# Patient Record
Sex: Female | Born: 1990
Health system: Southern US, Community
[De-identification: ages and names within clinical notes are randomized; demographics above are authoritative.]

## PROBLEM LIST (undated history)

## (undated) DIAGNOSIS — L0231 Cutaneous abscess of buttock: Secondary | ICD-10-CM

## (undated) HISTORY — DX: Cutaneous abscess of buttock: L02.31

---
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
## 2010-06-21 ENCOUNTER — Ambulatory Visit: Payer: Self-pay | Admitting: Nurse Practitioner

## 2010-06-21 ENCOUNTER — Inpatient Hospital Stay (HOSPITAL_COMMUNITY): Admission: AD | Admit: 2010-06-21 | Discharge: 2010-06-21 | Payer: Self-pay | Admitting: Obstetrics and Gynecology

## 2011-01-19 ENCOUNTER — Emergency Department (HOSPITAL_COMMUNITY)
Admission: EM | Admit: 2011-01-19 | Discharge: 2011-01-19 | Disposition: A | Discharge status: Home / Self Care | Payer: Medicaid Other | Attending: Emergency Medicine | Admitting: Emergency Medicine

## 2011-01-19 DIAGNOSIS — L0231 Cutaneous abscess of buttock: Secondary | ICD-10-CM | POA: Insufficient documentation

## 2011-01-19 DIAGNOSIS — L03317 Cellulitis of buttock: Secondary | ICD-10-CM | POA: Insufficient documentation

## 2011-02-22 LAB — WET PREP, GENITAL

## 2011-03-11 ENCOUNTER — Emergency Department (HOSPITAL_COMMUNITY)
Admission: EM | Admit: 2011-03-11 | Discharge: 2011-03-12 | Disposition: A | Discharge status: Home / Self Care | Payer: Medicaid Other | Attending: Emergency Medicine | Admitting: Emergency Medicine

## 2011-03-11 DIAGNOSIS — L03317 Cellulitis of buttock: Secondary | ICD-10-CM | POA: Insufficient documentation

## 2011-03-11 DIAGNOSIS — L0231 Cutaneous abscess of buttock: Secondary | ICD-10-CM | POA: Insufficient documentation

## 2011-03-13 ENCOUNTER — Emergency Department (HOSPITAL_COMMUNITY)
Admission: EM | Admit: 2011-03-13 | Discharge: 2011-03-13 | Disposition: A | Discharge status: Home / Self Care | Payer: Medicaid Other | Attending: Emergency Medicine | Admitting: Emergency Medicine

## 2011-03-13 DIAGNOSIS — L03317 Cellulitis of buttock: Secondary | ICD-10-CM | POA: Insufficient documentation

## 2011-03-13 DIAGNOSIS — L0231 Cutaneous abscess of buttock: Secondary | ICD-10-CM | POA: Insufficient documentation

## 2011-03-13 DIAGNOSIS — Z09 Encounter for follow-up examination after completed treatment for conditions other than malignant neoplasm: Secondary | ICD-10-CM | POA: Insufficient documentation

## 2011-03-30 ENCOUNTER — Inpatient Hospital Stay (INDEPENDENT_AMBULATORY_CARE_PROVIDER_SITE_OTHER)
Admission: RE | Admit: 2011-03-30 | Discharge: 2011-03-30 | Disposition: A | Discharge status: Home / Self Care | Payer: Medicaid Other | Source: Ambulatory Visit | Attending: Emergency Medicine | Admitting: Emergency Medicine

## 2011-03-30 DIAGNOSIS — A499 Bacterial infection, unspecified: Secondary | ICD-10-CM

## 2011-03-30 DIAGNOSIS — N760 Acute vaginitis: Secondary | ICD-10-CM

## 2011-03-30 LAB — POCT PREGNANCY, URINE: Preg Test, Ur: NEGATIVE

## 2011-03-30 LAB — WET PREP, GENITAL
Trich, Wet Prep: NONE SEEN
Yeast Wet Prep HPF POC: NONE SEEN

## 2011-04-25 NOTE — Op Note (Signed)
   Pamela Pratt, Pamela Pratt                         ACCOUNT NO.:  [id number]   MEDICAL RECORD NO.:  [id number]                   PATIENT TYPE:  AMB   LOCATION:  DSC                                  FACILITY:  [facility]   PHYSICIAN:  Vikki Ports, M.D.         DATE OF BIRTH:  [birth year]   DATE OF PROCEDURE:  09/23/2002  DATE OF DISCHARGE:                                 OPERATIVE REPORT   PREOPERATIVE DIAGNOSIS:  Left breast mass.   POSTOPERATIVE DIAGNOSIS:  Left breast mass.   PROCEDURE:  Excisional left breast biopsy.   SURGEON:  Vikki Ports, M.D.   ANESTHESIA:  General.   DESCRIPTION OF PROCEDURE:  The patient was taken to the operating room and  placed in the supine position. After adequate general anesthesia was induced  using laryngeal mask, the left breast was prepped and draped in the usual  sterile fashion.  Using a curvilinear incision extending from 4 o'clock to  the 6 o'clock periareolar, skin was excised and dissected down through  subcutaneous tissue.  Under what appeared to be an intraductal papilloma  surrounded by a well-encapsulated fatty lobule. This was excised in its  entirety. Adequate hemostasis was assured and the specimen was sent to  pathology.  The skin was closed with subcuticular 4-0 Monocryl.  Steri-  Strips and sterile dressings were applied. The patient tolerated the  procedure well and went to PACU in good condition.                                               Vikki Ports, M.D.    KRH/MEDQ  D:  09/23/2002  T:  09/25/2002  Job:  [id number]

## 2011-06-02 ENCOUNTER — Other Ambulatory Visit: Payer: Self-pay | Admitting: Obstetrics & Gynecology

## 2011-06-02 HISTORY — PX: COLPOSCOPY: SHX161

## 2011-07-09 DIAGNOSIS — L0231 Cutaneous abscess of buttock: Secondary | ICD-10-CM

## 2011-07-09 HISTORY — DX: Cutaneous abscess of buttock: L02.31

## 2011-07-26 ENCOUNTER — Emergency Department (HOSPITAL_COMMUNITY)
Admission: EM | Admit: 2011-07-26 | Discharge: 2011-07-26 | Disposition: A | Discharge status: Home / Self Care | Payer: Medicaid Other | Attending: Emergency Medicine | Admitting: Emergency Medicine

## 2011-07-26 DIAGNOSIS — L0231 Cutaneous abscess of buttock: Secondary | ICD-10-CM | POA: Insufficient documentation

## 2011-07-28 ENCOUNTER — Emergency Department (HOSPITAL_COMMUNITY)
Admission: EM | Admit: 2011-07-28 | Discharge: 2011-07-28 | Disposition: A | Discharge status: Home / Self Care | Payer: Medicaid Other | Attending: Emergency Medicine | Admitting: Emergency Medicine

## 2011-07-28 DIAGNOSIS — L03317 Cellulitis of buttock: Secondary | ICD-10-CM | POA: Insufficient documentation

## 2011-07-28 DIAGNOSIS — N898 Other specified noninflammatory disorders of vagina: Secondary | ICD-10-CM | POA: Insufficient documentation

## 2011-07-28 DIAGNOSIS — Z09 Encounter for follow-up examination after completed treatment for conditions other than malignant neoplasm: Secondary | ICD-10-CM | POA: Insufficient documentation

## 2011-07-28 DIAGNOSIS — L0231 Cutaneous abscess of buttock: Secondary | ICD-10-CM | POA: Insufficient documentation

## 2011-07-28 LAB — WET PREP, GENITAL: Yeast Wet Prep HPF POC: NONE SEEN

## 2011-07-28 LAB — URINALYSIS, ROUTINE W REFLEX MICROSCOPIC
Glucose, UA: NEGATIVE mg/dL
Hgb urine dipstick: NEGATIVE
Leukocytes, UA: NEGATIVE
pH: 6.5 (ref 5.0–8.0)

## 2011-08-18 ENCOUNTER — Encounter (INDEPENDENT_AMBULATORY_CARE_PROVIDER_SITE_OTHER): Payer: Self-pay | Admitting: Surgery

## 2011-08-22 ENCOUNTER — Ambulatory Visit (INDEPENDENT_AMBULATORY_CARE_PROVIDER_SITE_OTHER): Payer: Medicaid Other | Admitting: Surgery

## 2011-08-22 ENCOUNTER — Encounter (INDEPENDENT_AMBULATORY_CARE_PROVIDER_SITE_OTHER): Payer: Self-pay | Admitting: Surgery

## 2011-08-22 VITALS — BP 100/80 | HR 66 | Temp 98.6°F | Ht 63.0 in | Wt 135.0 lb

## 2011-08-22 DIAGNOSIS — L03317 Cellulitis of buttock: Secondary | ICD-10-CM

## 2011-08-22 DIAGNOSIS — L0231 Cutaneous abscess of buttock: Secondary | ICD-10-CM

## 2011-08-22 NOTE — Progress Notes (Signed)
ASSESSMENT AND PLAN: 1.  Bilateral  buttocks abscess.  Right abscess occurred about 5 months ago.  Left  abscess occurred about 1 month ago.  Both areas are healed.  There is no residual inflammation or mass.  I gave the patient a book on rectal disease.  2.  Smokes.  Knows it is bad for her health. 3.  On BCP.  Chief Complaint  Patient presents with  . Rectal Problems    hx of rectal abscess    HISTORY OF PRESENT ILLNESS: Pamela Pratt is a 20 y.o. (DOB: 10-26-1991)  AA female who is a patient of Willette Cluster, NP, [facility], and comes to me today for buttocks abscess.  The patient has had 2 episodes of buttocks abscesses. The first was on the left side and she went to the [facility] emergency room about 4 months ago. This was drained in the emergency room and the wound healed.  She then developed an abscess in the right buttocks, returned to the [facility] emergency room, and the right abscess was drained. This has also healed. She is seeing Willette Cluster at [facility].  She has had no prior history of skin disease or skin infections. 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  Past Medical History  Diagnosis Date  . Abscess of buttock, right 07/2011  . Asthma     Past Surgical History  Procedure Date  . Colposcopy 06/02/2011  . Breast surgery 2003    cyst    Current Outpatient Prescriptions  Medication Sig Dispense Refill  . levonorgestrel-ethinyl estradiol (SEASONALE,INTROVALE,JOLESSA) 0.15-0.03 MG tablet Take 1 tablet by mouth daily.          Allergies no known allergies  REVIEW OF SYSTEMS: Skin:  No history of rash.  No history of abnormal moles. Infection:  No history of hepatitis or HIV.  No history of MRSA. Neurologic:  No history of stroke.  No history of seizure.  No history of headaches. Cardiac:  No history of hypertension. No history of heart disease.  No history of prior cardiac  catheterization.  No history of seeing a cardiologist. Pulmonary:  Does not smoke cigarettes.  No asthma or bronchitis.  No OSA/CPAP.  Endocrine:  No diabetes. No thyroid disease. Gastrointestinal:  No history of stomach disease.  No history of liver disease.  No history of gall bladder disease.  No history of pancreas disease.  No history of colon disease. Urologic:  No history of kidney stones.  No history of bladder infections. Musculoskeletal:  No history of joint or back disease. Hematologic:  No bleeding disorder.  No history of anemia.  Not anticoagulated.  SOCIAL and FAMILY HISTORY: Comes by self.  At school at [employer] and works at [employer].  PHYSICAL EXAM: BP 100/80  Pulse 66  Temp(Src) 98.6 F (37 C) (Temporal)  Ht 5\' 3"  (1.6 m)  Wt 135 lb (61.236 kg)  BMI 23.91 kg/m2  General: Thin BF. HEENT: Normal. Pupils equal. Normal dentition. Neck: Supple. No thyroid mass.  Abdomen: No mass. No tenderness. No hernia. Normal bowel sounds.  No abdominal scars. Rectal: Scars in both buttocks.  No residual lesion or mass or inflammation.  Rectum - Okay. Extremities:  Good strength in upper and lower extremities.   DATA REVIEWED: Office notes from Willette Cluster, NP, [facility].    _____________________________  Ovidio Kin, M.D., FACS

## 2011-09-13 ENCOUNTER — Emergency Department (HOSPITAL_COMMUNITY): Payer: Medicaid Other

## 2011-09-13 ENCOUNTER — Emergency Department (HOSPITAL_COMMUNITY)
Admission: EM | Admit: 2011-09-13 | Discharge: 2011-09-13 | Disposition: A | Discharge status: Home / Self Care | Payer: Medicaid Other | Attending: Emergency Medicine | Admitting: Emergency Medicine

## 2011-09-13 DIAGNOSIS — X500XXA Overexertion from strenuous movement or load, initial encounter: Secondary | ICD-10-CM | POA: Insufficient documentation

## 2011-09-13 DIAGNOSIS — S93409A Sprain of unspecified ligament of unspecified ankle, initial encounter: Secondary | ICD-10-CM | POA: Insufficient documentation

## 2011-09-13 DIAGNOSIS — R609 Edema, unspecified: Secondary | ICD-10-CM | POA: Insufficient documentation

## 2011-09-13 DIAGNOSIS — M25579 Pain in unspecified ankle and joints of unspecified foot: Secondary | ICD-10-CM | POA: Insufficient documentation

## 2013-01-18 ENCOUNTER — Encounter (HOSPITAL_COMMUNITY): Payer: Self-pay | Admitting: *Deleted

## 2013-01-18 ENCOUNTER — Emergency Department (HOSPITAL_COMMUNITY)
Admission: EM | Admit: 2013-01-18 | Discharge: 2013-01-18 | Disposition: A | Discharge status: Home / Self Care | Payer: Self-pay | Attending: Emergency Medicine | Admitting: Emergency Medicine

## 2013-01-18 DIAGNOSIS — Z9889 Other specified postprocedural states: Secondary | ICD-10-CM | POA: Insufficient documentation

## 2013-01-18 DIAGNOSIS — Z3202 Encounter for pregnancy test, result negative: Secondary | ICD-10-CM | POA: Insufficient documentation

## 2013-01-18 DIAGNOSIS — F17200 Nicotine dependence, unspecified, uncomplicated: Secondary | ICD-10-CM | POA: Insufficient documentation

## 2013-01-18 DIAGNOSIS — Z872 Personal history of diseases of the skin and subcutaneous tissue: Secondary | ICD-10-CM | POA: Insufficient documentation

## 2013-01-18 DIAGNOSIS — A64 Unspecified sexually transmitted disease: Secondary | ICD-10-CM | POA: Insufficient documentation

## 2013-01-18 DIAGNOSIS — J45909 Unspecified asthma, uncomplicated: Secondary | ICD-10-CM | POA: Insufficient documentation

## 2013-01-18 LAB — WET PREP, GENITAL

## 2013-01-18 LAB — URINALYSIS, ROUTINE W REFLEX MICROSCOPIC
Glucose, UA: NEGATIVE mg/dL
Hgb urine dipstick: NEGATIVE
Ketones, ur: NEGATIVE mg/dL
Protein, ur: NEGATIVE mg/dL

## 2013-01-18 LAB — POCT PREGNANCY, URINE: Preg Test, Ur: NEGATIVE

## 2013-01-18 MED ORDER — CEFTRIAXONE SODIUM 250 MG IJ SOLR
250.0000 mg | Freq: Once | INTRAMUSCULAR | Status: AC
  Administered 2013-01-18: 250 mg via INTRAMUSCULAR
  Filled 2013-01-18: qty 250

## 2013-01-18 MED ORDER — DOXYCYCLINE HYCLATE 100 MG PO CAPS: 100.0000 mg | ORAL_CAPSULE | Freq: Two times a day (BID) | ORAL | Status: DC

## 2013-01-18 NOTE — ED Notes (Signed)
Pt alert and oriented x4. Respirations even and unlabored, bilateral symmetrical rise and fall of chest. Skin warm and dry. In no acute distress. Denies needs.   

## 2013-01-18 NOTE — ED Notes (Signed)
Pt escorted to discharge window. Pt verbalized understanding discharge instructions. In no acute distress.  Pt stated she does not want to take PO abx until she is positive she has chlamydia. Pt made aware her need to start taking abx. Pt reports she will wait until Monday [facility].
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
## 2013-01-19 LAB — GC/CHLAMYDIA PROBE AMP
CT Probe RNA: NEGATIVE
GC Probe RNA: NEGATIVE

## 2013-09-10 ENCOUNTER — Encounter (HOSPITAL_COMMUNITY): Payer: Self-pay | Admitting: Emergency Medicine

## 2013-09-10 ENCOUNTER — Emergency Department (HOSPITAL_COMMUNITY)
Admission: EM | Admit: 2013-09-10 | Discharge: 2013-09-10 | Disposition: A | Discharge status: Home / Self Care | Payer: Medicaid Other | Attending: Emergency Medicine | Admitting: Emergency Medicine

## 2013-09-10 DIAGNOSIS — L0501 Pilonidal cyst with abscess: Secondary | ICD-10-CM | POA: Insufficient documentation

## 2013-09-10 DIAGNOSIS — J45909 Unspecified asthma, uncomplicated: Secondary | ICD-10-CM | POA: Insufficient documentation

## 2013-09-10 DIAGNOSIS — F17200 Nicotine dependence, unspecified, uncomplicated: Secondary | ICD-10-CM | POA: Insufficient documentation

## 2013-09-10 DIAGNOSIS — Z79899 Other long term (current) drug therapy: Secondary | ICD-10-CM | POA: Insufficient documentation

## 2013-09-10 DIAGNOSIS — Z23 Encounter for immunization: Secondary | ICD-10-CM | POA: Insufficient documentation

## 2013-09-10 DIAGNOSIS — IMO0001 Reserved for inherently not codable concepts without codable children: Secondary | ICD-10-CM | POA: Insufficient documentation

## 2013-09-10 MED ORDER — HYDROCODONE-ACETAMINOPHEN 5-325 MG PO TABS: 1.0000 | ORAL_TABLET | ORAL | Status: DC | PRN

## 2013-09-10 MED ORDER — TETANUS-DIPHTH-ACELL PERTUSSIS 5-2.5-18.5 LF-MCG/0.5 IM SUSP
0.5000 mL | Freq: Once | INTRAMUSCULAR | Status: AC
  Administered 2013-09-10: 0.5 mL via INTRAMUSCULAR
  Filled 2013-09-10 (×2): qty 0.5

## 2013-09-10 MED ORDER — LIDOCAINE-EPINEPHRINE 2 %-1:100000 IJ SOLN
20.0000 mL | Freq: Once | INTRAMUSCULAR | Status: AC
  Administered 2013-09-10: 20 mL via INTRADERMAL
  Filled 2013-09-10: qty 1

## 2013-09-10 NOTE — ED Notes (Signed)
Pt c/o pylonidal cyst x 1 wk.

## 2013-09-10 NOTE — ED Provider Notes (Signed)
CSN: [id number]     Arrival date & time 09/10/13  7829 History   First MD Initiated Contact with Patient 09/10/13 [phone]     Chief Complaint  Patient presents with  . Abscess   (Consider location/radiation/quality/duration/timing/severity/associated sxs/prior Treatment) HPI  22 year old female with history of abscess to her buttock presents for evaluations of abscess. Patient reports gradual onset of pain and swelling to her mid buttock ongoing for a week. Symptoms getting progressively worse. Painful to palpation. Describes a sharp and throbbing pain. Feels similar to prior abscess. She has been using warm compress once daily with minimal relief. No other specific treatment tried no complaints of fever, rectal pain, blood per rectum, any recent injury, or rash. She cannot recall her last tetanus shot.  Past Medical History  Diagnosis Date  . Abscess of buttock, right 07/2011  . Asthma    Past Surgical History  Procedure Laterality Date  . Colposcopy  06/02/2011  . Breast surgery  2003    cyst   No family history on file. History  Substance Use Topics  . Smoking status: Current Every Day Smoker -- 0.30 packs/day    Types: Cigarettes  . Smokeless tobacco: Not on file  . Alcohol Use: Yes   OB History   Grav Para Term Preterm Abortions TAB SAB Ect Mult Living                 Review of Systems  Constitutional: Negative for fever.  Gastrointestinal: Negative for blood in stool and rectal pain.  Musculoskeletal: Positive for myalgias.  Skin: Negative for rash.    Allergies  Review of patient's allergies indicates no known allergies.  Home Medications   Current Outpatient Rx  Name  Route  Sig  Dispense  Refill  . Cholecalciferol (VITAMIN D3) 1000 UNITS CAPS   Oral   Take 1,000 Units by mouth daily at 12 noon.         Marland Kitchen doxycycline (VIBRAMYCIN) 100 MG capsule   Oral   Take 1 capsule (100 mg total) by mouth 2 (two) times daily.   20 capsule   0   . hydrocortisone  cream 1 %   Topical   Apply 1 application topically daily as needed (Uses after shaving.).         Marland Kitchen levonorgestrel-ethinyl estradiol (ALTAVERA) 0.15-30 MG-MCG tablet   Oral   Take 1 tablet by mouth daily at 12 noon.         . Multiple Vitamin (MULTIVITAMIN WITH MINERALS) TABS   Oral   Take 1 tablet by mouth daily at 12 noon.         . vitamin C (ASCORBIC ACID) 500 MG tablet   Oral   Take 500 mg by mouth daily at 12 noon.          There were no vitals taken for this visit. Physical Exam  Nursing note and vitals reviewed. Constitutional: She appears well-nourished. No distress.  Neck: Neck supple.  Abdominal: There is no tenderness.  Genitourinary:  Chaperone present  piloninal cyst with abscess noted.  Ttp.  No obvious rectal involvement.  No cellulitis  Neurological: She is alert.  Skin: Skin is warm.  Psychiatric: She has a normal mood and affect.    ED Course  Procedures (including critical care time)  INCISION AND DRAINAGE Performed by: Fayrene Helper Consent: Verbal consent obtained. Risks and benefits: risks, benefits and alternatives were discussed Type: abscess  Body area: pilonidal cyst  Anesthesia: local infiltration  Incision was  made with a scalpel.  Local anesthetic: lidocaine 2% w/o epinephrine  Anesthetic total: 5 ml  Complexity: complex Blunt dissection to break up loculations  Drainage: purulent  Drainage amount: moderate  Packing material: 1/4 in iodoform gauze  Patient tolerance: Patient tolerated the procedure well with no immediate complications.    Labs Review Labs Reviewed - No data to display Imaging Review No results found.  MDM   1. Pilonidal cyst with abscess    BP 128/80  Pulse 67  Temp(Src) 99.2 F (37.3 C) (Oral)  Resp 16  SpO2 100%     Fayrene Helper, PA-C 09/10/13 1106

## 2013-09-12 ENCOUNTER — Encounter: Payer: Self-pay | Admitting: Obstetrics

## 2013-09-12 NOTE — ED Provider Notes (Signed)
Medical screening examination/treatment/procedure(s) were performed by non-physician practitioner and as supervising physician I was immediately available for consultation/collaboration.   Laray Anger, DO 09/12/13 1301

## 2013-09-19 ENCOUNTER — Emergency Department (HOSPITAL_COMMUNITY)
Admission: EM | Admit: 2013-09-19 | Discharge: 2013-09-20 | Disposition: A | Discharge status: Home / Self Care | Payer: Medicaid Other | Attending: Emergency Medicine | Admitting: Emergency Medicine

## 2013-09-19 ENCOUNTER — Encounter (HOSPITAL_COMMUNITY): Payer: Self-pay | Admitting: Emergency Medicine

## 2013-09-19 DIAGNOSIS — Z3202 Encounter for pregnancy test, result negative: Secondary | ICD-10-CM | POA: Insufficient documentation

## 2013-09-19 DIAGNOSIS — N939 Abnormal uterine and vaginal bleeding, unspecified: Secondary | ICD-10-CM

## 2013-09-19 DIAGNOSIS — Z79899 Other long term (current) drug therapy: Secondary | ICD-10-CM | POA: Insufficient documentation

## 2013-09-19 DIAGNOSIS — N898 Other specified noninflammatory disorders of vagina: Secondary | ICD-10-CM | POA: Insufficient documentation

## 2013-09-19 DIAGNOSIS — F17200 Nicotine dependence, unspecified, uncomplicated: Secondary | ICD-10-CM | POA: Insufficient documentation

## 2013-09-19 DIAGNOSIS — Z872 Personal history of diseases of the skin and subcutaneous tissue: Secondary | ICD-10-CM | POA: Insufficient documentation

## 2013-09-19 DIAGNOSIS — J45909 Unspecified asthma, uncomplicated: Secondary | ICD-10-CM | POA: Insufficient documentation

## 2013-09-19 LAB — POCT I-STAT, CHEM 8
BUN: 18 mg/dL (ref 6–23)
Calcium, Ion: 1.12 mmol/L (ref 1.12–1.23)
Glucose, Bld: 88 mg/dL (ref 70–99)
HCT: 42 % (ref 36.0–46.0)
Hemoglobin: 14.3 g/dL (ref 12.0–15.0)
Potassium: 4.7 mEq/L (ref 3.5–5.1)
Sodium: 138 mEq/L (ref 135–145)
TCO2: 23 mmol/L (ref 0–100)

## 2013-09-19 NOTE — ED Notes (Signed)
Pt report vaginal bleeding since the 7th of last month. Bleeding accompanied with ab pain. Pt denies n/v.FLow rate varied from spotting and gradually getting heavier. Pt reports using pad and tampons and changing every 2 hours.

## 2013-09-20 LAB — URINALYSIS, ROUTINE W REFLEX MICROSCOPIC
Glucose, UA: NEGATIVE mg/dL
Hgb urine dipstick: NEGATIVE
Ketones, ur: 15 mg/dL — AB
Leukocytes, UA: NEGATIVE
Nitrite: NEGATIVE
Specific Gravity, Urine: 1.031 — ABNORMAL HIGH (ref 1.005–1.030)
Urobilinogen, UA: 0.2 mg/dL (ref 0.0–1.0)

## 2013-09-20 LAB — WET PREP, GENITAL
Clue Cells Wet Prep HPF POC: NONE SEEN
Yeast Wet Prep HPF POC: NONE SEEN

## 2013-09-20 LAB — GC/CHLAMYDIA PROBE AMP: CT Probe RNA: NEGATIVE

## 2013-09-20 LAB — URINE MICROSCOPIC-ADD ON

## 2013-09-20 NOTE — ED Provider Notes (Signed)
CSN: [id number]     Arrival date & time 09/19/13  2115 History   First MD Initiated Contact with Patient 09/19/13 2256     Chief Complaint  Patient presents with  . Vaginal Bleeding   (Consider location/radiation/quality/duration/timing/severity/associated sxs/prior Treatment) HPI Comments: Patient presents today with a chief complaint of vaginal bleeding.  She reports that the bleeding has been present since 08-14-13.   She reports that the bleeding has been heavy at times and spotting at times.  She has also had intermittent lower abdominal cramping over the past month.  She reports that the pain is "very mild" at this time.  She reports that her last regular menstrual period was in February of 2013.  She is currently on Depo.  Last Depo shot was three months ago.  She denies any vaginal discharge.  She reports that she is currently sexually active.  She denies nausea, vomiting, diarrhea, fever, chills, SOB, dizziness, or lightheadedness.  She reports that her Gynecologist is at [facility].  The history is provided by the patient.    Past Medical History  Diagnosis Date  . Abscess of buttock, right 07/2011  . Asthma    Past Surgical History  Procedure Laterality Date  . Colposcopy  06/02/2011  . Breast surgery  2003    cyst   History reviewed. No pertinent family history. History  Substance Use Topics  . Smoking status: Current Every Day Smoker -- 0.30 packs/day    Types: Cigarettes  . Smokeless tobacco: Not on file  . Alcohol Use: Yes   OB History   Grav Para Term Preterm Abortions TAB SAB Ect Mult Living                 Review of Systems  Genitourinary: Positive for vaginal discharge.  All other systems reviewed and are negative.    Allergies  Review of patient's allergies indicates no known allergies.  Home Medications   Current Outpatient Rx  Name  Route  Sig  Dispense  Refill  . Cholecalciferol (VITAMIN D3) 1000 UNITS CAPS   Oral   Take 1,000  Units by mouth daily at 12 noon.         Marland Kitchen HYDROcodone-acetaminophen (NORCO/VICODIN) 5-325 MG per tablet   Oral   Take 1 tablet by mouth every 4 (four) hours as needed for pain.   10 tablet   0   . ibuprofen (ADVIL,MOTRIN) 200 MG tablet   Oral   Take 600 mg by mouth every 6 (six) hours as needed for pain (pain).         . Multiple Vitamin (MULTIVITAMIN WITH MINERALS) TABS   Oral   Take 1 tablet by mouth daily at 12 noon.         . Probiotic Product (PROBIOTIC ACIDOPHILUS) CAPS   Oral   Take 1 capsule by mouth daily.         . vitamin C (ASCORBIC ACID) 500 MG tablet   Oral   Take 500 mg by mouth daily at 12 noon.         . medroxyPROGESTERone (DEPO-PROVERA) 150 MG/ML injection   Intramuscular   Inject 150 mg into the muscle every 3 (three) months.          BP 146/96  Pulse 78  Temp(Src) 97.5 F (36.4 C) (Oral)  Resp 20  Ht 5\' 3"  (1.6 m)  Wt 135 lb (61.236 kg)  BMI 23.92 kg/m2  SpO2 100%  LMP 08/14/2013 Physical Exam  Nursing  note and vitals reviewed. Constitutional: She appears well-developed and well-nourished.  HENT:  Head: Normocephalic and atraumatic.  Mouth/Throat: Oropharynx is clear and moist.  Neck: Normal range of motion. Neck supple.  Cardiovascular: Normal rate, regular rhythm and normal heart sounds.   Pulmonary/Chest: Effort normal and breath sounds normal.  Abdominal: Soft. Bowel sounds are normal. She exhibits no distension and no mass. There is no tenderness. There is no rebound and no guarding.  Genitourinary:    Cervix exhibits no motion tenderness. Right adnexum displays no mass, no tenderness and no fullness. Left adnexum displays no mass, no tenderness and no fullness.  No bleeding visualized with pelvic exam  Neurological: She is alert.  Skin: Skin is warm and dry.  Psychiatric: She has a normal mood and affect.    ED Course  Procedures (including critical care time) Labs Review Labs Reviewed  GC/CHLAMYDIA PROBE AMP  WET  PREP, GENITAL  PREGNANCY, URINE  URINALYSIS, ROUTINE W REFLEX MICROSCOPIC  POCT I-STAT, CHEM 8   Imaging Review No results found.  EKG Interpretation   None       MDM  No diagnosis found. Patient presenting with a chief complaint of vaginal bleeding.  Patient is hemodynamically stable.  Hemoglobin is 14.3.  No pain on abdominal exam.  No pain with pelvic exam.  No bleeding visualized with pelvic exam.  Urine pregnancy negative.  UA negative.  Feel that the patient is stable for discharge.  Patient instructed to follow up with [facility].  Return precautions given.    Pascal Lux Morgan, PA-C 09/20/13 [phone]

## 2013-09-21 NOTE — ED Provider Notes (Signed)
Medical screening examination/treatment/procedure(s) were performed by non-physician practitioner and as supervising physician I was immediately available for consultation/collaboration.   Suzi Roots, MD 09/21/13 1120

## 2013-10-21 ENCOUNTER — Ambulatory Visit: Payer: Self-pay

## 2013-11-02 ENCOUNTER — Encounter: Payer: Medicaid Other | Admitting: Obstetrics & Gynecology

## 2015-01-25 ENCOUNTER — Emergency Department (HOSPITAL_COMMUNITY)
Admission: EM | Admit: 2015-01-25 | Discharge: 2015-01-25 | Disposition: A | Discharge status: Home / Self Care | Payer: Self-pay | Attending: Emergency Medicine | Admitting: Emergency Medicine

## 2015-01-25 ENCOUNTER — Encounter (HOSPITAL_COMMUNITY): Payer: Self-pay | Admitting: Emergency Medicine

## 2015-01-25 DIAGNOSIS — Z79899 Other long term (current) drug therapy: Secondary | ICD-10-CM | POA: Insufficient documentation

## 2015-01-25 DIAGNOSIS — J45909 Unspecified asthma, uncomplicated: Secondary | ICD-10-CM | POA: Insufficient documentation

## 2015-01-25 DIAGNOSIS — L02416 Cutaneous abscess of left lower limb: Secondary | ICD-10-CM | POA: Insufficient documentation

## 2015-01-25 DIAGNOSIS — Z720 Tobacco use: Secondary | ICD-10-CM | POA: Insufficient documentation

## 2015-01-25 MED ORDER — LIDOCAINE-EPINEPHRINE (PF) 2 %-1:200000 IJ SOLN
5.0000 mL | Freq: Once | INTRAMUSCULAR | Status: AC
  Administered 2015-01-25: 5 mL
  Filled 2015-01-25: qty 20

## 2015-01-25 NOTE — ED Notes (Signed)
Pt has inner left thigh abscess. Tried warm compress but was unable to "pop the abscess." Denies fevers, chills. No other c/c.

## 2015-01-25 NOTE — ED Notes (Signed)
Questions/concerns denied, pt a&ox4 ambulatory at dc

## 2015-01-25 NOTE — Discharge Instructions (Signed)
Return to the emergency room with worsening of symptoms, new symptoms or with symptoms that are concerning. Continue with warm compresses.   Present to your primary care doctor or the urgent care of your choice, or the ED for wound recheck in 2 days.   Every attempt was made to remove foreign body (contaminants) from the wound.  However, there is always a chance that some may remain in the wound. This can  increase your risk of infection.   If you see signs of infection (warmth, redness, tenderness, pus, sharp increase in pain, fever, red streaking in the skin) immediately return to the emergency department. Read below information and follow recommendations.   Abscess Care After An abscess (also called a boil or furuncle) is an infected area that contains a collection of pus. Signs and symptoms of an abscess include pain, tenderness, redness, or hardness, or you may feel a moveable soft area under your skin. An abscess can occur anywhere in the body. The infection may spread to surrounding tissues causing cellulitis. A cut (incision) by the surgeon was made over your abscess and the pus was drained out. Gauze may have been packed into the space to provide a drain that will allow the cavity to heal from the inside outwards. The boil may be painful for 5 to 7 days. Most people with a boil do not have high fevers. Your abscess, if seen early, may not have localized, and may not have been lanced. If not, another appointment may be required for this if it does not get better on its own or with medications. HOME CARE INSTRUCTIONS   Only take over-the-counter or prescription medicines for pain, discomfort, or fever as directed by your caregiver.  When you bathe, soak and then remove gauze or iodoform packs at least daily or as directed by your caregiver. You may then wash the wound gently with mild soapy water. Repack with gauze or do as your caregiver directs. SEEK IMMEDIATE MEDICAL CARE IF:   You  develop increased pain, swelling, redness, drainage, or bleeding in the wound site.  You develop signs of generalized infection including muscle aches, chills, fever, or a general ill feeling.  An oral temperature above 102 F (38.9 C) develops, not controlled by medication. See your caregiver for a recheck if you develop any of the symptoms described above. If medications (antibiotics) were prescribed, take them as directed. Document Released: 06/12/2005 Document Revised: 02/16/2012 Document Reviewed: 02/07/2008 [facility] Patient Information 2015 [place], [place]. This information is not intended to replace advice given to you by your health care provider. Make sure you discuss any questions you have with your health care provider.

## 2015-01-25 NOTE — ED Provider Notes (Signed)
CSN: [id number]     Arrival date & time 01/25/15  1704 History   First MD Initiated Contact with Patient 01/25/15 1848     Chief Complaint  Patient presents with  . Abscess     (Consider location/radiation/quality/duration/timing/severity/associated sxs/prior Treatment) HPI  Pamela Pratt is a 24 y.o. female with PMH of abscess, asthma presenting with 1 week of left inner thigh abscess. She's been treating with warm compresses, but has not noted any discharge. She stated in the past couple days. It is swollen and become more painful. She denies fevers, chills, nausea, vomiting. No history of diabetes, HIV. Patient has had history of abscesses before on her buttocks in a similar location.   Past Medical History  Diagnosis Date  . Abscess of buttock, right 07/2011  . Asthma    Past Surgical History  Procedure Laterality Date  . Colposcopy  06/02/2011  . Breast surgery  2003    cyst   History reviewed. No pertinent family history. History  Substance Use Topics  . Smoking status: Current Every Day Smoker -- 0.30 packs/day    Types: Cigarettes  . Smokeless tobacco: Not on file  . Alcohol Use: Yes   OB History    No data available     Review of Systems  Constitutional: Negative for fever and chills.  Gastrointestinal: Negative for nausea and vomiting.  Skin: Negative for color change and wound.      Allergies  Review of patient's allergies indicates no known allergies.  Home Medications   Prior to Admission medications   Medication Sig Start Date End Date Taking? Authorizing Provider  Cholecalciferol (VITAMIN D3) 1000 UNITS CAPS Take 1,000 Units by mouth daily at 12 noon.   Yes Historical Provider, MD  medroxyPROGESTERone (DEPO-PROVERA) 150 MG/ML injection Inject 150 mg into the muscle every 3 (three) months.   Yes Historical Provider, MD  Multiple Vitamin (MULTIVITAMIN WITH MINERALS) TABS Take 1 tablet by mouth daily at 12 noon.   Yes Historical Provider, MD   HYDROcodone-acetaminophen (NORCO/VICODIN) 5-325 MG per tablet Take 1 tablet by mouth every 4 (four) hours as needed for pain. Patient not taking: Reported on 01/25/2015 09/10/13   Fayrene Helper, PA-C  ibuprofen (ADVIL,MOTRIN) 200 MG tablet Take 600 mg by mouth every 6 (six) hours as needed for pain (pain).    Historical Provider, MD   BP 123/76 mmHg  Pulse 80  Temp(Src) 99.2 F (37.3 C) (Oral)  Resp 18  SpO2 100%  LMP 11/22/2014 (Approximate) Physical Exam  Constitutional: She appears well-developed and well-nourished. No distress.  HENT:  Head: Normocephalic and atraumatic.  Eyes: Conjunctivae are normal. Right eye exhibits no discharge. Left eye exhibits no discharge.  Pulmonary/Chest: Effort normal. No respiratory distress.  Neurological: She is alert. Coordination normal.  Skin: She is not diaphoretic.  2-3 cm fluctuant mass to the left inner thigh, proximal. There is surrounding induration but no erythema. Mild evidence of cellulitis. No red streaks.  Nursing note and vitals reviewed.   ED Course  Procedures (including critical care time) Labs Review Labs Reviewed - No data to display  Imaging Review No results found.   EKG Interpretation None      INCISION AND DRAINAGE Performed by: Louann Sjogren Consent: Verbal consent obtained. Risks and benefits: risks, benefits and alternatives were discussed Type: abscess  Body area: Left proximal inner thigh  Anesthesia: local infiltration  Incision was made with a scalpel.  Local anesthetic: lidocaine 2% with epinephrine  Anesthetic total: 2 ml  Complexity: complex Blunt dissection to break up loculations  Drainage: purulent  Drainage amount: copious  Packing material: 1/4 in iodoform gauze  Patient tolerance: Patient tolerated the procedure well with no immediate complications.     MDM   Final diagnoses:  Abscess of left thigh   Patient with skin abscess amenable to incision and drainage.  Abscess ,  packed with iodoform gauze,  wound recheck in 2 days. Encouraged home warm soaks and flushing.  Mild signs of cellulitis is surrounding skin.  Will d/c to home.  No antibiotic therapy is indicated. Patient with up-to-date tetanus in last 5 years.  Discussed return precautions with patient. Discussed all results and patient verbalizes understanding and agrees with plan.    Louann SjogrenVictoria L Athan Casalino, PA-C 01/25/15 2122  Donnetta HutchingBrian Cook, MD 01/27/15 [phone]

## 2015-09-23 ENCOUNTER — Encounter (HOSPITAL_COMMUNITY): Payer: Self-pay

## 2015-09-23 ENCOUNTER — Emergency Department (HOSPITAL_COMMUNITY): Payer: Self-pay

## 2015-09-23 ENCOUNTER — Emergency Department (HOSPITAL_COMMUNITY)
Admission: EM | Admit: 2015-09-23 | Discharge: 2015-09-23 | Disposition: A | Discharge status: Home / Self Care | Payer: Self-pay | Attending: Emergency Medicine | Admitting: Emergency Medicine

## 2015-09-23 DIAGNOSIS — Z79899 Other long term (current) drug therapy: Secondary | ICD-10-CM | POA: Insufficient documentation

## 2015-09-23 DIAGNOSIS — Y9389 Activity, other specified: Secondary | ICD-10-CM | POA: Insufficient documentation

## 2015-09-23 DIAGNOSIS — Z872 Personal history of diseases of the skin and subcutaneous tissue: Secondary | ICD-10-CM | POA: Insufficient documentation

## 2015-09-23 DIAGNOSIS — S63501A Unspecified sprain of right wrist, initial encounter: Secondary | ICD-10-CM | POA: Insufficient documentation

## 2015-09-23 DIAGNOSIS — Y9289 Other specified places as the place of occurrence of the external cause: Secondary | ICD-10-CM | POA: Insufficient documentation

## 2015-09-23 DIAGNOSIS — Y998 Other external cause status: Secondary | ICD-10-CM | POA: Insufficient documentation

## 2015-09-23 DIAGNOSIS — Z720 Tobacco use: Secondary | ICD-10-CM | POA: Insufficient documentation

## 2015-09-23 DIAGNOSIS — W1839XA Other fall on same level, initial encounter: Secondary | ICD-10-CM | POA: Insufficient documentation

## 2015-09-23 DIAGNOSIS — J45909 Unspecified asthma, uncomplicated: Secondary | ICD-10-CM | POA: Insufficient documentation

## 2015-09-23 MED ORDER — IBUPROFEN 800 MG PO TABS: 800.0000 mg | ORAL_TABLET | Freq: Three times a day (TID) | ORAL | Status: DC

## 2015-09-23 NOTE — ED Notes (Signed)
She c/o fall with outstretched arm injury yesterday--c/o right wrist discomfort radiating into fingers.  She is able to pronate/supinate.  CMS intact all fingers bilat.

## 2015-09-23 NOTE — Discharge Instructions (Signed)
Ganglion Cyst A ganglion cyst is a noncancerous, fluid-filled lump that occurs near joints or tendons. The ganglion cyst grows out of a joint or the lining of a tendon. It most often develops in the hand or wrist, but it can also develop in the shoulder, elbow, hip, knee, ankle, or foot. The round or oval ganglion cyst can be the size of a pea or larger than a grape. Increased activity may enlarge the size of the cyst because more fluid starts to build up.  CAUSES It is not known what causes a ganglion cyst to grow. However, it may be related to:  Inflammation or irritation around the joint.  An injury.  Repetitive movements or overuse.  Arthritis. RISK FACTORS Risk factors include:  Being a woman.  Being age 25-50. SIGNS AND SYMPTOMS Symptoms may include:   A lump. This most often appears on the hand or wrist, but it can occur in other areas of the body.  Tingling.  Pain.  Numbness.  Muscle weakness.  Weak grip.  Less movement in a joint. DIAGNOSIS Ganglion cysts are most often diagnosed based on a physical exam. Your health care provider will feel the lump and may shine a light alongside it. If it is a ganglion cyst, a light often shines through it. Your health care provider may order an X-ray, ultrasound, or MRI to rule out other conditions. TREATMENT Ganglion cysts usually go away on their own without treatment. If pain or other symptoms are involved, treatment may be needed. Treatment is also needed if the ganglion cyst limits your movement or if it gets infected. Treatment may include:  Wearing a brace or splint on your wrist or finger.  Taking anti-inflammatory medicine.  Draining fluid from the lump with a needle (aspiration).  Injecting a steroid into the joint.  Surgery to remove the ganglion cyst. HOME CARE INSTRUCTIONS  Do not press on the ganglion cyst, poke it with a needle, or hit it.  Take medicines only as directed by your health care  provider.  Wear your brace or splint as directed by your health care provider.  Watch your ganglion cyst for any changes.  Keep all follow-up visits as directed by your health care provider. This is important. SEEK MEDICAL CARE IF:  Your ganglion cyst becomes larger or more painful.  You have increased redness, red streaks, or swelling.  You have pus coming from the lump.  You have weakness or numbness in the affected area.  You have a fever or chills.   This information is not intended to replace advice given to you by your health care provider. Make sure you discuss any questions you have with your health care provider.   Document Released: 11/21/2000 Document Revised: 12/15/2014 Document Reviewed: 05/09/2014 Elsevier Interactive Patient Education 2016 [employer] Wrist Pain There are many things that can cause wrist pain. Some common causes include:  An injury to the wrist area, such as a sprain, strain, or fracture.  Overuse of the joint.  A condition that causes increased pressure on a nerve in the wrist (carpal tunnel syndrome).  Wear and tear of the joints that occurs with aging (osteoarthritis).  A variety of other types of arthritis. Sometimes, the cause of wrist pain is not known. The pain often goes away when you follow your health care provider's instructions for relieving pain at home. If your wrist pain continues, tests may need to be done to diagnose your condition. HOME CARE INSTRUCTIONS Pay attention to any  changes in your symptoms. Take these actions to help with your pain:  Rest the wrist area for at least 48 hours or as told by your health care provider.  If directed, apply ice to the injured area:  Put ice in a plastic bag.  Place a towel between your skin and the bag.  Leave the ice on for 20 minutes, 2-3 times per day.  Keep your arm raised (elevated) above the level of your heart while you are sitting or lying down.  If a splint or elastic  bandage has been applied, use it as told by your health care provider.  Remove the splint or bandage only as told by your health care provider.  Loosen the splint or bandage if your fingers become numb or have a tingling feeling, or if they turn cold or blue.  Take over-the-counter and prescription medicines only as told by your health care provider.  Keep all follow-up visits as told by your health care provider. This is important. SEEK MEDICAL CARE IF:  Your pain is not helped by treatment.  Your pain gets worse. SEEK IMMEDIATE MEDICAL CARE IF:  Your fingers become swollen.  Your fingers turn white, very red, or cold and blue.  Your fingers are numb or have a tingling feeling.  You have difficulty moving your fingers.   This information is not intended to replace advice given to you by your health care provider. Make sure you discuss any questions you have with your health care provider.   Document Released: 09/03/2005 Document Revised: 08/15/2015 Document Reviewed: 04/11/2015 Elsevier Interactive Patient Education [employer]

## 2015-09-23 NOTE — ED Notes (Signed)
Bed: WTR6 Expected date:  Expected time:  Means of arrival:  Comments: 

## 2015-09-23 NOTE — ED Notes (Signed)
Pt did not get prescription for Motrin, called in to [facility] after speaking with K Sophia PA. Motrin 800 mg TID disp 21. Pt made aware of med call in

## 2015-09-23 NOTE — ED Provider Notes (Signed)
CSN: [id number]     Arrival date & time 09/23/15  4098 History   First MD Initiated Contact with Patient 09/23/15 [phone]     Chief Complaint  Patient presents with  . Wrist Injury     (Consider location/radiation/quality/duration/timing/severity/associated sxs/prior Treatment) Patient is a 24 y.o. female presenting with wrist injury. The history is provided by the patient. No language interpreter was used.  Wrist Injury Location:  Wrist Time since incident:  1 day Injury: yes   Mechanism of injury: fall   Fall:    Fall occurred:  Standing Wrist location:  R wrist Pain details:    Quality:  Aching   Radiates to:  Does not radiate   Severity:  Moderate   Onset quality:  Gradual   Timing:  Constant   Progression:  Worsening Chronicity:  New Dislocation: no   Foreign body present:  No foreign bodies Tetanus status:  Up to date Prior injury to area:  No Relieved by:  Nothing Worsened by:  Nothing tried Ineffective treatments:  None tried Associated symptoms: swelling   Risk factors: no frequent fractures     Past Medical History  Diagnosis Date  . Abscess of buttock, right 07/2011  . Asthma    Past Surgical History  Procedure Laterality Date  . Colposcopy  06/02/2011  . Breast surgery  2003    cyst   No family history on file. Social History  Substance Use Topics  . Smoking status: Current Every Day Smoker -- 0.30 packs/day    Types: Cigarettes  . Smokeless tobacco: None  . Alcohol Use: Yes   OB History    No data available     Review of Systems  Musculoskeletal: Positive for myalgias and joint swelling.  All other systems reviewed and are negative.     Allergies  Review of patient's allergies indicates no known allergies.  Home Medications   Prior to Admission medications   Medication Sig Start Date End Date Taking? Authorizing Provider  Cholecalciferol (VITAMIN D3) 1000 UNITS CAPS Take 1,000 Units by mouth daily at 12 noon.    Historical  Provider, MD  HYDROcodone-acetaminophen (NORCO/VICODIN) 5-325 MG per tablet Take 1 tablet by mouth every 4 (four) hours as needed for pain. Patient not taking: Reported on 01/25/2015 09/10/13   Fayrene Helper, PA-C  ibuprofen (ADVIL,MOTRIN) 200 MG tablet Take 600 mg by mouth every 6 (six) hours as needed for pain (pain).    Historical Provider, MD  medroxyPROGESTERone (DEPO-PROVERA) 150 MG/ML injection Inject 150 mg into the muscle every 3 (three) months.    Historical Provider, MD  Multiple Vitamin (MULTIVITAMIN WITH MINERALS) TABS Take 1 tablet by mouth daily at 12 noon.    Historical Provider, MD   BP 131/73 mmHg  Pulse 84  Temp(Src) 98.3 F (36.8 C) (Oral)  Resp 16  SpO2 100%  LMP 08/11/2015 (Exact Date) Physical Exam  Constitutional: She is oriented to person, place, and time. She appears well-developed and well-nourished.  Musculoskeletal: She exhibits tenderness.  Tender right wrist,  Dorsal aspect shows swelling,  Possible ganglion cyst.   Neurological: She is alert and oriented to person, place, and time. She has normal reflexes.  Skin: Skin is warm.  Psychiatric: She has a normal mood and affect.  Nursing note and vitals reviewed.   ED Course  Procedures (including critical care time) Labs Review Labs Reviewed - No data to display  Imaging Review Dg Wrist Complete Right  09/23/2015  CLINICAL DATA:  Right wrist pain post  fall 09/22/2015 EXAM: RIGHT WRIST - COMPLETE 3+ VIEW COMPARISON:  None. FINDINGS: There is no evidence of fracture or dislocation. There is no evidence of arthropathy or other focal bone abnormality. Soft tissues are unremarkable. IMPRESSION: Negative. Electronically Signed   By: Natasha MeadLiviu  Pop M.D.   On: 09/23/2015 10:25   I have personally reviewed and evaluated these images and lab results as part of my medical decision-making.   EKG Interpretation None      MDM xray normal.  Pt placed in a wrist splint.  She is advised to take ibuprofen and follow up with  Dr. Mina MarbleWeingold orthopaedist for recheck in 1 week   Final diagnoses:  Wrist sprain, right, initial encounter    Ibuprofen Wrist sprain avs   Elson AreasLeslie K Sofia, PA-C 09/23/15 1048  Richardean Canalavid H Yao, MD 09/23/15 [phone]

## 2016-05-08 DIAGNOSIS — Z3041 Encounter for surveillance of contraceptive pills: Secondary | ICD-10-CM | POA: Diagnosis not present

## 2016-10-23 DIAGNOSIS — N760 Acute vaginitis: Secondary | ICD-10-CM | POA: Diagnosis not present

## 2016-10-23 DIAGNOSIS — Z01411 Encounter for gynecological examination (general) (routine) with abnormal findings: Secondary | ICD-10-CM | POA: Diagnosis not present

## 2016-10-23 DIAGNOSIS — Z01419 Encounter for gynecological examination (general) (routine) without abnormal findings: Secondary | ICD-10-CM | POA: Diagnosis not present

## 2016-10-23 DIAGNOSIS — Z3041 Encounter for surveillance of contraceptive pills: Secondary | ICD-10-CM | POA: Diagnosis not present

## 2017-07-30 ENCOUNTER — Other Ambulatory Visit (HOSPITAL_COMMUNITY)
Admission: RE | Admit: 2017-07-30 | Discharge: 2017-07-30 | Disposition: A | Discharge status: Home / Self Care | Payer: BLUE CROSS/BLUE SHIELD | Source: Ambulatory Visit | Attending: Family Medicine | Admitting: Family Medicine

## 2017-07-30 ENCOUNTER — Ambulatory Visit (INDEPENDENT_AMBULATORY_CARE_PROVIDER_SITE_OTHER): Payer: BLUE CROSS/BLUE SHIELD | Admitting: Internal Medicine

## 2017-07-30 ENCOUNTER — Encounter: Payer: Self-pay | Admitting: Internal Medicine

## 2017-07-30 VITALS — BP 120/80 | HR 74 | Temp 98.3°F | Ht 64.0 in | Wt 130.0 lb

## 2017-07-30 DIAGNOSIS — Z0000 Encounter for general adult medical examination without abnormal findings: Secondary | ICD-10-CM | POA: Diagnosis not present

## 2017-07-30 DIAGNOSIS — Z720 Tobacco use: Secondary | ICD-10-CM

## 2017-07-30 DIAGNOSIS — R82998 Other abnormal findings in urine: Secondary | ICD-10-CM | POA: Insufficient documentation

## 2017-07-30 DIAGNOSIS — N898 Other specified noninflammatory disorders of vagina: Secondary | ICD-10-CM | POA: Insufficient documentation

## 2017-07-30 DIAGNOSIS — R8299 Other abnormal findings in urine: Secondary | ICD-10-CM

## 2017-07-30 LAB — POCT URINALYSIS DIP (MANUAL ENTRY)
Bilirubin, UA: NEGATIVE
GLUCOSE UA: NEGATIVE mg/dL
Leukocytes, UA: NEGATIVE
NITRITE UA: NEGATIVE
Protein Ur, POC: 30 mg/dL — AB
SPEC GRAV UA: 1.025 (ref 1.010–1.025)
Urobilinogen, UA: 4 E.U./dL — AB
pH, UA: 6 (ref 5.0–8.0)

## 2017-07-30 LAB — POCT WET PREP (WET MOUNT)
Clue Cells Wet Prep Whiff POC: NEGATIVE
Trichomonas Wet Prep HPF POC: ABSENT

## 2017-07-30 LAB — POCT URINE PREGNANCY: Preg Test, Ur: NEGATIVE

## 2017-07-30 NOTE — Assessment & Plan Note (Signed)
UA positive for protein and urobiligen, along with trace rbcs Patient needs further work up including repeat UA, blood work including possibly hemolysis labs Will probably need a renal ultrasound followed by referral to nephrology depending on repeat labs  Will call patient to come in two weeks for follow up

## 2017-07-30 NOTE — Assessment & Plan Note (Signed)
Had recent pap smear this year. Awaiting release of records

## 2017-07-30 NOTE — Progress Notes (Signed)
   [facility] Noralee Chars, MD Phone: [phone]  Reason For Visit: New Patient    # Dark urine for the past 2 months.  No changes in patient's diet or intake recently. No dysuria. No increased frequency/urgency of urination. No Pelvic pain. No kidney issues in family. No nausea or vomiting.    # VAGINAL DISCHARGE  Having vaginal discharge for about two weeks. Denies any vaginal irritation. Medications tried: none  Discharge consistency: thin  Discharge color: white Recent antibiotic use: none  Sex in last month: yes Possible STD exposure:this is old relationship, however would like to be tested for STIs   Symptoms Fever: none Dysuria:none Vaginal bleeding: none Abdomen or Pelvic pain: none  Back pain: none  Genital sores or ulcers:none  Pain during sex: none  Missed menstrual period: none   Past Medical History Reviewed problem list.  Medications- reviewed and updated No additions to family history Social history- patient is a smoker  Objective: BP 120/80   Pulse 74   Temp 98.3 F (36.8 C) (Oral)   Ht 5\' 4"  (1.626 m)   Wt 130 lb (59 kg)   LMP 07/08/2017   SpO2 98%   BMI 22.31 kg/m  Gen: NAD, alert, cooperative with exam HEENT: Normal    Neck: No masses palpated. No lymphadenopathy    Ears: Tympanic membranes intact, normal light reflex, no erythema, no bulging    Eyes: PERRLA, EOMI    Nose: nasal turbinates moist    Throat: moist mucus membranes, no erythema Cardio: regular rate and rhythm, S1S2 heard, no murmurs appreciated Pulm: clear to auscultation bilaterally, no wheezes, rhonchi or rales GI: soft, non-tender, non-distended, bowel sounds present, no hepatomegaly, no splenomegaly GU: external vaginal tissue wnl, cervix wnl, punctate lesions on cervix appreciated, light white vaginal discharge from vaginal vault,  no discharge from cervical os, no cervical motion tenderness,no abdominal/ adnexal masses Extremities: warm, well  perfused, No edema, cyanosis or clubbing;  MSK: Normal gait and station Skin: dry, intact, no rashes or lesions Neuro: Strength and sensation grossly intact  Patient reports vaginal discharge. No  vision/ hearing changes,anorexia, weight change, fever ,adenopathy, persistant / recurrent hoarseness, swallowing issues, chest pain, edema,persistant / recurrent cough, hemoptysis, dyspnea(rest, exertional, paroxysmal nocturnal), gastrointestinal  bleeding (melena, rectal bleeding), abdominal pain, excessive heart burn, GU symptoms(dysuria, hematuria, pyuria, voiding/incontinence  Issues) syncope, focal weakness, severe memory loss, concerning skin lesions, depression, anxiety, abnormal bruising/bleeding, major joint swelling, breast masses or abnormal vaginal bleeding.    Assessment/Plan: See problem based a/p  Dark urine UA positive for protein and urobiligen, along with trace rbcs Patient needs further work up including repeat UA, blood work including possibly hemolysis labs Will probably need a renal ultrasound followed by referral to nephrology depending on repeat labs  Will call patient to come in two weeks for follow up   Vaginal discharge Normal vaginal exam  Will test for STDs per request. States had a recent HIV and RPR test does not want today - Cervicovaginal ancillary only - POCT Wet Prep [employer] Had recent pap smear this year. Awaiting release of records

## 2017-07-30 NOTE — Assessment & Plan Note (Signed)
Normal vaginal exam  Will test for STDs per request. States had a recent HIV and RPR test does not want today - Cervicovaginal ancillary only - POCT Wet Prep [facility]

## 2017-07-30 NOTE — Patient Instructions (Addendum)
It was nice to me you today. I will follow up with your lab results and let you now what they are - likely results will be back in about 2-3 days, so sometime next week.

## 2017-07-31 ENCOUNTER — Telehealth: Payer: Self-pay | Admitting: Internal Medicine

## 2017-07-31 NOTE — Telephone Encounter (Signed)
Please call patient and let her know that her urine shows some protein. I want her to follow up on her dark urine in about two weeks to discuss next steps.

## 2017-08-03 LAB — CERVICOVAGINAL ANCILLARY ONLY
CHLAMYDIA, DNA PROBE: NEGATIVE
NEISSERIA GONORRHEA: NEGATIVE

## 2017-08-04 NOTE — Telephone Encounter (Signed)
Left message on voicemail for patient to call back. 

## 2017-08-04 NOTE — Telephone Encounter (Signed)
Pt return call to nurse line.  She did not answer my return call, will await callback . Eldena Dede, Maryjo Rochester, CMA

## 2017-08-04 NOTE — Telephone Encounter (Signed)
Pt informed.  Appt made for 08/18/17 with Dr. Cathlean Cower. Velma Agnes, Maryjo Rochester, CMA

## 2017-08-05 NOTE — Telephone Encounter (Signed)
Thank you Pamela Pratt! 

## 2017-08-18 ENCOUNTER — Ambulatory Visit (INDEPENDENT_AMBULATORY_CARE_PROVIDER_SITE_OTHER): Payer: BLUE CROSS/BLUE SHIELD | Admitting: Internal Medicine

## 2017-08-18 ENCOUNTER — Encounter: Payer: Self-pay | Admitting: Internal Medicine

## 2017-08-18 VITALS — BP 122/82 | Temp 98.4°F | Ht 64.0 in | Wt 130.0 lb

## 2017-08-18 DIAGNOSIS — R8299 Other abnormal findings in urine: Secondary | ICD-10-CM

## 2017-08-18 DIAGNOSIS — R82998 Other abnormal findings in urine: Secondary | ICD-10-CM

## 2017-08-18 LAB — POCT URINALYSIS DIP (MANUAL ENTRY)
BILIRUBIN UA: NEGATIVE
Glucose, UA: NEGATIVE mg/dL
Ketones, POC UA: NEGATIVE mg/dL
LEUKOCYTES UA: NEGATIVE
Nitrite, UA: NEGATIVE
PH UA: 7.5 (ref 5.0–8.0)
Protein Ur, POC: NEGATIVE mg/dL
RBC UA: NEGATIVE
Spec Grav, UA: 1.01 (ref 1.010–1.025)
Urobilinogen, UA: 0.2 E.U./dL

## 2017-08-18 NOTE — Patient Instructions (Signed)
Urine is back to normal. I believe this is in part due to taking plenty of fluids. I will check your blood work for any findings that might be significant. Follow-up as needed.

## 2017-08-18 NOTE — Progress Notes (Deleted)
   [facility] Noralee CharsAsiyah Malkie Wille, MD Phone: [phone]  Reason For Visit:   # *** -   Past Medical History Reviewed problem list.  Medications- reviewed and updated No additions to family history Social history- patient is a *** smoker  Objective: BP 122/82   Temp 98.4 F (36.9 C) (Oral)   Ht 5\' 4"  (1.626 m)   Wt 130 lb (59 kg)   LMP 08/11/2017   BMI 22.31 kg/m  Gen: NAD, alert, cooperative with exam HEENT: Normal    Neck: No masses palpated. No lymphadenopathy    Ears: Tympanic membranes intact, normal light reflex, no erythema, no bulging    Eyes: PERRLA, EOMI    Nose: nasal turbinates moist    Throat: moist mucus membranes, no erythema Cardio: regular rate and rhythm, S1S2 heard, no murmurs appreciated Pulm: clear to auscultation bilaterally, no wheezes, rhonchi or rales GI: soft, non-tender, non-distended, bowel sounds present, no hepatomegaly, no splenomegaly GU: external vaginal tissue ***, cervix ***, *** punctate lesions on cervix appreciated, *** discharge from cervical os, *** bleeding, *** cervical motion tenderness, *** abdominal/ adnexal masses Extremities: warm, well perfused, No edema, cyanosis or clubbing;  MSK: Normal gait and station Skin: dry, intact, no rashes or lesions Neuro: Strength and sensation grossly intact   Assessment/Plan: See problem based a/p  No problem-specific Assessment & Plan notes found for this encounter.

## 2017-08-18 NOTE — Progress Notes (Signed)
   [facility] Noralee CharsAsiyah Proctor Carriker, MD Phone: [phone]  Reason For Visit: Follow up for Urine Protein and Urobilinogen   # Dark urine  - Seen at last appointment for dark urine  - Protein in urine as well as urobilinogen noted  - Patient indicates her urine color has improved with drink more fluids  - Patient denies any urinary symptoms - frequency, urgency, dysuria - No abdominal pain  Past Medical History Reviewed problem list.  Medications- reviewed and updated No additions to family history Social history- patient is a  Non- smoker  Objective: BP 122/82   Temp 98.4 F (36.9 C) (Oral)   Ht 5\' 4"  (1.626 m)   Wt 130 lb (59 kg)   LMP 08/11/2017   BMI 22.31 kg/m  Gen: NAD, alert, cooperative with exam GI: soft, non-tender, non-distended, bowel sounds present, no hepatomegaly, no splenomegaly, negative CVA tenderness  Skin: dry, intact, no rashes or lesions   Assessment/Plan: See problem based a/p  Dark urine Repeat Dipstick negative today for protein or urobilinogen. Patient indicates eat lots of meat, which could be the reason for protein in urine. Sometimes urobilinogen can be a sign of hemolysis, however could simple be spurious. Will get CBC to check hemoglobin. Repeat urine without any abnormalities today  - POCT urinalysis dipstick - CBC - Comprehensive metabolic panel  -Discussed with Dr. Perley JainMcDiarmid

## 2017-08-19 LAB — CBC
HEMATOCRIT: 43.8 % (ref 34.0–46.6)
Hemoglobin: 14.2 g/dL (ref 11.1–15.9)
MCH: 29.5 pg (ref 26.6–33.0)
MCHC: 32.4 g/dL (ref 31.5–35.7)
MCV: 91 fL (ref 79–97)
PLATELETS: 322 10*3/uL (ref 150–379)
RBC: 4.81 x10E6/uL (ref 3.77–5.28)
RDW: 13.2 % (ref 12.3–15.4)
WBC: 6 10*3/uL (ref 3.4–10.8)

## 2017-08-19 LAB — COMPREHENSIVE METABOLIC PANEL
ALT: 38 IU/L — AB (ref 0–32)
AST: 27 IU/L (ref 0–40)
Albumin/Globulin Ratio: 2.2 (ref 1.2–2.2)
Albumin: 4.4 g/dL (ref 3.5–5.5)
Alkaline Phosphatase: 35 IU/L — ABNORMAL LOW (ref 39–117)
BUN/Creatinine Ratio: 13 (ref 9–23)
BUN: 12 mg/dL (ref 6–20)
Bilirubin Total: 0.5 mg/dL (ref 0.0–1.2)
CO2: 22 mmol/L (ref 20–29)
CREATININE: 0.92 mg/dL (ref 0.57–1.00)
Calcium: 9.5 mg/dL (ref 8.7–10.2)
Chloride: 103 mmol/L (ref 96–106)
GFR calc Af Amer: 99 mL/min/{1.73_m2} (ref >59)
GFR, EST NON AFRICAN AMERICAN: 86 mL/min/{1.73_m2} (ref >59)
Globulin, Total: 2 g/dL (ref 1.5–4.5)
Glucose: 93 mg/dL (ref 65–99)
Potassium: 4.8 mmol/L (ref 3.5–5.2)
Sodium: 142 mmol/L (ref 134–144)
TOTAL PROTEIN: 6.4 g/dL (ref 6.0–8.5)

## 2017-08-20 NOTE — Assessment & Plan Note (Addendum)
Repeat Dipstick negative today for protein or urobilinogen. Patient indicates eat lots of meat, which could be the reason for protein in urine. Sometimes urobilinogen can be a sign of hemolysis, however could simple be spurious. Will get CBC to check hemoglobin. Repeat urine without any abnormalities today  - POCT urinalysis dipstick - CBC - Comprehensive metabolic panel  -Discussed with Dr. Perley JainMcDiarmid

## 2017-08-21 ENCOUNTER — Encounter: Payer: Self-pay | Admitting: Internal Medicine

## 2017-11-18 ENCOUNTER — Telehealth: Payer: Self-pay | Admitting: Internal Medicine

## 2017-11-18 DIAGNOSIS — Z30019 Encounter for initial prescription of contraceptives, unspecified: Secondary | ICD-10-CM

## 2017-11-18 NOTE — Telephone Encounter (Signed)
Pt needs to refill her BC.  She has been taking Curevelo.  [facility] [address] She says the health dept has been prescribing it for her but she is no longer going to the health dept.

## 2017-11-23 DIAGNOSIS — IMO0001 Reserved for inherently not codable concepts without codable children: Secondary | ICD-10-CM | POA: Insufficient documentation

## 2017-11-23 MED ORDER — LEVONORGESTREL-ETHINYL ESTRAD 0.15-30 MG-MCG PO TABS
1.0000 | ORAL_TABLET | Freq: Every day | ORAL | 1 refills | Status: DC
Start: 2017-11-23 — End: 2017-11-27

## 2017-11-23 MED ORDER — LEVONORGESTREL-ETHINYL ESTRAD 0.15-30 MG-MCG PO TABS: 1.0000 | ORAL_TABLET | Freq: Every day | ORAL | 1 refills | Status: DC

## 2017-11-23 NOTE — Telephone Encounter (Signed)
I have prescribed her two months. She will need to follow up with me for further as we have not discussed birth control in the past

## 2017-11-24 NOTE — Telephone Encounter (Signed)
LM on  VM to call back regarding her prescription.

## 2017-11-27 ENCOUNTER — Encounter: Payer: Self-pay | Admitting: Internal Medicine

## 2017-11-27 ENCOUNTER — Ambulatory Visit: Payer: BLUE CROSS/BLUE SHIELD | Admitting: Internal Medicine

## 2017-11-27 ENCOUNTER — Other Ambulatory Visit: Payer: Self-pay

## 2017-11-27 DIAGNOSIS — H9201 Otalgia, right ear: Secondary | ICD-10-CM

## 2017-11-27 DIAGNOSIS — Z3041 Encounter for surveillance of contraceptive pills: Secondary | ICD-10-CM

## 2017-11-27 MED ORDER — LEVONORGESTREL-ETHINYL ESTRAD 0.15-30 MG-MCG PO TABS: 1.0000 | ORAL_TABLET | Freq: Every day | ORAL | 3 refills | Status: DC

## 2017-11-27 NOTE — Progress Notes (Signed)
   Subjective:   Patient: Pamela Pratt       Birthdate: Jul 01, 1991       MRN: [id number]      HPI  Pierre L Christell ConstantMoore is a 26 y.o. female presenting to discuss contraception.   Contraception Patient currently taking Kurvelo OCPs. Previously had Depo, last injection 2014. [facility] been [facility], but would now like for this to be refilled by her PCP. LMP about one week ago. Is having unprotected sex. Has never been pregnant before. Endorses regular vaginal discharge that is not irritating or painful. Is not concerned about exposure to STDs.   R ear pain Began about a week ago. Describes pain as throbbing. Has significantly improved though is still present intermittently. Has tried putting peroxide in her ear and taking Tylenol with some improvement in symptoms. Denies hearing changes. Endorses wax coming out of her ear but denies discharge. Denies fevers, cough, sore throat, nasal congestion. Does endorse some tooth pain on same side. Has not been seen by a dentist in a while.   Smoking status reviewed. Patient is current every day smoker.   Review of Systems See HPI.     Objective:  Physical Exam  Constitutional: She is oriented to person, place, and time and well-developed, well-nourished, and in no distress.  HENT:  Cerumen impaction on R. Minimal amount of cerumen on L, TM non-bulging, non-erythematous.   After irrigation, R TM easily visible, non-erythematous, non-bulging.   Fair dentition. Multiple fillings noted. No obvious signs of tooth decay or abscess.   Pulmonary/Chest: Effort normal. No respiratory distress.  Neurological: She is alert and oriented to person, place, and time.  Skin: Skin is warm and dry.  Psychiatric: Affect and judgment normal.      Assessment & Plan:  Right ear pain Significant cerumen impaction on R improved after irrigation. After irrigation, ear clear and Tm visible. TM non-erythematous, non-bulging. No  abnormalities of L ear. Likely 2/2 tooth pain; no abnormalities on dental exam. Can take ibuprofen/Tylenol as needed. Schedule dental appt if pain persists or worsens.   Birth control Doing well on Kurvelo OCP. LMP one week ago. Will provide one year refill today. Encouraged patient to use condoms to prevent STDs.    Tarri AbernethyAbigail J Bay Wayson, MD, MPH PGY-3 [facility] Pager [phone]

## 2017-11-27 NOTE — Assessment & Plan Note (Signed)
Doing well on Kurvelo OCP. LMP one week ago. Will provide one year refill today. Encouraged patient to use condoms to prevent STDs.

## 2017-11-27 NOTE — Assessment & Plan Note (Addendum)
Significant cerumen impaction on R improved after irrigation. After irrigation, ear clear and Tm visible. TM non-erythematous, non-bulging. No abnormalities of L ear. Likely 2/2 tooth pain; no abnormalities on dental exam. Can take ibuprofen/Tylenol as needed. Schedule dental appt if pain persists or worsens.

## 2017-11-27 NOTE — Patient Instructions (Addendum)
It was nice meeting you today Pamela Pratt!  You can take ibuprofen or Tylenol as needed for your ear/tooth pain. If the pain continues or worsens, please schedule a dental appointment.   DO NOT use Q-tips or anything else inside your ears. Your ears do a great job of cleaning themselves.   I have refilled your birth control. You can pick this up at the pharmacy today.   If you have any questions or concerns, please feel free to call the clinic.   Be well,  Dr. Natale MilchLancaster

## 2018-06-08 ENCOUNTER — Other Ambulatory Visit: Payer: Self-pay

## 2018-06-08 ENCOUNTER — Encounter: Payer: Self-pay | Admitting: Family Medicine

## 2018-06-08 ENCOUNTER — Ambulatory Visit (INDEPENDENT_AMBULATORY_CARE_PROVIDER_SITE_OTHER): Payer: BLUE CROSS/BLUE SHIELD | Admitting: Family Medicine

## 2018-06-08 ENCOUNTER — Other Ambulatory Visit (HOSPITAL_COMMUNITY)
Admission: RE | Admit: 2018-06-08 | Discharge: 2018-06-08 | Disposition: A | Discharge status: Home / Self Care | Payer: BLUE CROSS/BLUE SHIELD | Source: Ambulatory Visit | Attending: Family Medicine | Admitting: Family Medicine

## 2018-06-08 VITALS — BP 110/70 | HR 65 | Temp 98.7°F | Ht 63.0 in | Wt 135.0 lb

## 2018-06-08 DIAGNOSIS — Z1211 Encounter for screening for malignant neoplasm of colon: Secondary | ICD-10-CM | POA: Diagnosis not present

## 2018-06-08 DIAGNOSIS — Z124 Encounter for screening for malignant neoplasm of cervix: Secondary | ICD-10-CM

## 2018-06-08 DIAGNOSIS — R87610 Atypical squamous cells of undetermined significance on cytologic smear of cervix (ASC-US): Secondary | ICD-10-CM | POA: Diagnosis not present

## 2018-06-08 DIAGNOSIS — Z113 Encounter for screening for infections with a predominantly sexual mode of transmission: Secondary | ICD-10-CM

## 2018-06-08 DIAGNOSIS — Z0000 Encounter for general adult medical examination without abnormal findings: Secondary | ICD-10-CM

## 2018-06-08 DIAGNOSIS — Z720 Tobacco use: Secondary | ICD-10-CM

## 2018-06-08 DIAGNOSIS — Z01419 Encounter for gynecological examination (general) (routine) without abnormal findings: Secondary | ICD-10-CM | POA: Diagnosis not present

## 2018-06-08 NOTE — Assessment & Plan Note (Addendum)
-  10 year smoking history, smoking approximately 1/4 pack per day. -Discussed smoking cessation, however patient is not interested in quitting currently. She is aware we can provide additional resources and medications if she decides to pursue smoking cessation.

## 2018-06-08 NOTE — Progress Notes (Signed)
   Subjective:    Patient ID: Pamela Pratt, female    DOB: 1991-07-28, 28 y.o.   MRN: [id number]   Pamela Pratt is a 27 year old female that presents for a physical. The patient is doing well and has no complaints for today's visit.   Gyn/STI Screening: Patient requests a papsmear and STI screening today. She is intermittently sexually active in a non-monogamous relationship, and uses condoms occasionally. She is on birth control pills and endorses adherence with this. She denies any nausea, abdominal pain, vaginal discharge, dysuria, dyspareunia, or vaginal itching.   Social/Preventive care: Patient has an approximately 2.5 year smoking history (10 year history, 1/4 pack per day), and currently is not interested in quitting. She works in Clinical biochemistcustomer service for [employer] and often sits for the majority of the day without physical activity.    Smoking status reviewed  Review of Systems: Per HPI.    Patient Active Problem List   Diagnosis Date Noted  . Health care maintenance 06/08/2018  . Routine screening for STI (sexually transmitted infection) 06/08/2018  . Birth control 11/23/2017  . Tobacco abuse 07/30/2017     Objective:  BP 110/70   Pulse 65   Temp 98.7 F (37.1 C) (Oral)   Ht 5\' 3"  (1.6 m)   Wt 135 lb (61.2 kg)   LMP 06/01/2018   SpO2 98%   BMI 23.91 kg/m  Vitals and nursing note reviewed  General: NAD, pleasant Cardiac: RRR, normal heart sounds, no murmurs Respiratory: CTAB, normal effort Abdomen: soft, nontender, nondistended Extremities: no edema or cyanosis. WWP. Skin: warm and dry, no rashes noted Neuro: alert and oriented, no focal deficits Psych: normal affect  Pelvic Exam:        External: normal female genitalia without lesions or masses        Vagina: normal without lesions or masses, piercing superior labia.         Cervix: normal without lesions or masses        Pap smear: performed        Samples Trich/GC/Chlamydia obtained  Assessment & Plan:     Health care maintenance -Papsmear performed today, cytology pending  -Discussed including physical activity into her daily schedule. She is willing to try small steps including exercising during TV commercials and walking for a few minutes every hour during work.   Tobacco abuse -10 year smoking history, smoking approximately 1/4 pack per day. -Discussed smoking cessation, however patient is not interested in quitting currently. She is aware we can provide additional resources and medications if she decides to pursue smoking cessation.    Routine screening for STI (sexually transmitted infection) -Trich/GC/Chlamydia samples obtained and pending  -HIV/syphillis/hepatitis panel ordered  -Encouraged patient to use condoms to prevent STDs   Leticia PennaSamantha Beard, DO  Family Medicine Resident PGY-1

## 2018-06-08 NOTE — Assessment & Plan Note (Addendum)
-  Trich/GC/Chlamydia samples obtained and pending  -HIV/syphillis/hepatitis panel ordered  -Encouraged patient to use condoms to prevent STDs

## 2018-06-08 NOTE — Patient Instructions (Signed)
So nice to meet you today! You were seen for a preventive physical and papsmear today. I encourage you to start adding physical activity into your day and cut back on smoking. If you have any questions or concerns, do not hesitate to call. Thank you so much!

## 2018-06-08 NOTE — Assessment & Plan Note (Addendum)
-  Papsmear performed today, cytology pending  -Discussed including physical activity into her daily schedule. She is willing to try small steps including exercising during TV commercials and walking for a few minutes every hour during work.

## 2018-06-09 ENCOUNTER — Encounter: Payer: Self-pay | Admitting: Family Medicine

## 2018-06-09 LAB — HIV ANTIBODY (ROUTINE TESTING W REFLEX): HIV Screen 4th Generation wRfx: NONREACTIVE

## 2018-06-09 LAB — HEPATITIS PANEL, ACUTE
HEP A IGM: NEGATIVE
HEP B C IGM: NEGATIVE
HEP B S AG: NEGATIVE

## 2018-06-09 LAB — RPR: RPR: NONREACTIVE

## 2018-06-10 LAB — CERVICOVAGINAL ANCILLARY ONLY
Chlamydia: NEGATIVE
NEISSERIA GONORRHEA: NEGATIVE
TRICH (WINDOWPATH): NEGATIVE

## 2018-06-15 ENCOUNTER — Encounter: Payer: Self-pay | Admitting: Family Medicine

## 2018-06-15 LAB — CYTOLOGY - PAP
Diagnosis: UNDETERMINED — AB
HPV (WINDOPATH): DETECTED — AB

## 2018-06-24 ENCOUNTER — Ambulatory Visit (INDEPENDENT_AMBULATORY_CARE_PROVIDER_SITE_OTHER): Payer: BLUE CROSS/BLUE SHIELD | Admitting: Family Medicine

## 2018-06-24 ENCOUNTER — Encounter: Payer: Self-pay | Admitting: Family Medicine

## 2018-06-24 ENCOUNTER — Other Ambulatory Visit: Payer: Self-pay

## 2018-06-24 VITALS — BP 128/78 | HR 79 | Temp 98.8°F | Wt 135.0 lb

## 2018-06-24 DIAGNOSIS — R87610 Atypical squamous cells of undetermined significance on cytologic smear of cervix (ASC-US): Secondary | ICD-10-CM | POA: Diagnosis not present

## 2018-06-24 DIAGNOSIS — A630 Anogenital (venereal) warts: Secondary | ICD-10-CM | POA: Diagnosis not present

## 2018-06-24 LAB — POCT URINE PREGNANCY: PREG TEST UR: NEGATIVE

## 2018-06-24 MED ORDER — ACETAMINOPHEN 500 MG PO TABS
500.0000 mg | ORAL_TABLET | Freq: Once | ORAL | Status: AC
  Administered 2018-06-24: 500 mg via ORAL

## 2018-06-24 NOTE — Progress Notes (Signed)
Patient ID: Pamela BouchardPecolla L Kinyon, female   DOB: 04/11/1991, 27 y.o.   MRN: [id number]   [facility] ATTENDING NOTE Nicolette BangKehinde Kashton Mcartor,MD I  have seen and examined this patient, reviewed their chart. I have discussed this patient with the resident. I agree with the resident's findings, assessment and care plan.  Procedure performed with the resident.  Neg Acetowhite lesion on Exam. Neg abnormal vascularization. Normal Lugol's uptake. We will contact patient with EEC result.

## 2018-06-24 NOTE — Patient Instructions (Signed)
Colposcopy, Care After  This sheet gives you information about how to care for yourself after your procedure. Your doctor may also give you more specific instructions. If you have problems or questions, contact your doctor.  What can I expect after the procedure?  If you did not have a tissue sample removed (did not have a biopsy), you may only have some spotting for a few days. You can go back to your normal activities.  If you had a tissue sample removed, it is common to have:  · Soreness and pain. This may last for a few days.  · Light-headedness.  · Mild bleeding from your vagina or dark-colored, grainy discharge from your vagina. This may last for a few days. You may need to wear a sanitary pad.  · Spotting for at least 48 hours after the procedure.    Follow these instructions at home:  · Take over-the-counter and prescription medicines only as told by your doctor. Ask your doctor what medicines you can start taking again. This is very important if you take blood-thinning medicine.  · Do not drive or use heavy machinery while taking prescription pain medicine.  · For 3 days, or as long as your doctor tells you, avoid:  ? Douching.  ? Using tampons.  ? Having sex.  · If you use birth control (contraception), keep using it.  · Limit activity for the first day after the procedure. Ask your doctor what activities are safe for you.  · It is up to you to get the results of your procedure. Ask your doctor when your results will be ready.  · Keep all follow-up visits as told by your doctor. This is important.  Contact a doctor if:  · You get a skin rash.  Get help right away if:  · You are bleeding a lot from your vagina. It is a lot of bleeding if you are using more than one pad an hour for 2 hours in a row.  · You have clumps of blood (blood clots) coming from your vagina.  · You have a fever.  · You have chills  · You have pain in your lower belly (pelvic area).  · You have signs of infection, such as vaginal  discharge that is:  ? Different than usual.  ? Yellow.  ? Bad-smelling.  · You have very pain or cramps in your lower belly that do not get better with medicine.  · You feel light-headed.  · You feel dizzy.  · You pass out (faint).  Summary  · If you did not have a tissue sample removed (did not have a biopsy), you may only have some spotting for a few days. You can go back to your normal activities.  · If you had a tissue sample removed, it is common to have mild pain and spotting for 48 hours.  · For 3 days, or as long as your doctor tells you, avoid douching, using tampons and having sex.  · Get help right away if you have bleeding, very bad pain, or signs of infection.  This information is not intended to replace advice given to you by your health care provider. Make sure you discuss any questions you have with your health care provider.  Document Released: 05/12/2008 Document Revised: 08/13/2016 Document Reviewed: 08/13/2016  Elsevier Interactive Patient Education © 2018 [employer]vier Inc[employer]

## 2018-06-24 NOTE — Progress Notes (Deleted)
Patient ID: Pamela BouchardPecolla L Pratt, female   DOB: 10-Sep-1991, 27 y.o.   MRN: [id number]  No chief complaint on file.   HPI Pamela Pratt is a 27 y.o. female.  *** HPI  Indications: Pap smear on {MONTH:10108} 20*** showed: {Findings; lab pap smear results:16707::"no abnormalities"}. Previous colposcopy: {Exam; colposcopy summary:733::"in ***"}. Prior cervical treatment: {Therapies; recommendations colposcopy:727}.  Past Medical History:  Diagnosis Date  . Abscess of buttock, right 07/2011  . Asthma     Past Surgical History:  Procedure Laterality Date  . BREAST SURGERY  2003   cyst  . COLPOSCOPY  06/02/2011    Family History  Problem Relation Age of Onset  . Diabetes Maternal Grandfather   . Diabetes Maternal Aunt     Social History Social History   Tobacco Use  . Smoking status: Current Every Day Smoker    Packs/day: 0.25    Types: Cigarettes  . Smokeless tobacco: Never Used  Substance Use Topics  . Alcohol use: Yes  . Drug use: No    No Known Allergies  Current Outpatient Medications  Medication Sig Dispense Refill  . Cholecalciferol (VITAMIN D3) 1000 UNITS CAPS Take 1,000 Units by mouth daily at 12 noon.    Marland Kitchen. ibuprofen (ADVIL,MOTRIN) 800 MG tablet Take 1 tablet (800 mg total) by mouth 3 (three) times daily. 21 tablet 0  . levonorgestrel-ethinyl estradiol (KURVELO) 0.15-30 MG-MCG tablet Take 1 tablet by mouth daily. 3 Package 3  . Multiple Vitamin (MULTIVITAMIN WITH MINERALS) TABS Take 1 tablet by mouth daily at 12 noon.     No current facility-administered medications for this visit.     Review of Systems Review of Systems  Blood pressure 128/78, pulse 79, temperature 98.8 F (37.1 C), temperature source Oral, weight 135 lb (61.2 kg), last menstrual period 06/01/2018, SpO2 99 %.  Physical Exam Physical Exam   Data Reviewed ***  Assessment    Procedure Details  The risks and benefits of the procedure and Written informed consent obtained.  Speculum  placed in vagina and excellent visualization of cervix achieved, cervix swabbed x 3 with acetic acid solution.  Specimens: ***  Complications: none.     Plan    Specimens labelled and sent to Pathology. Return to discuss Pathology results in 2 weeks.      Enzo Treu [name] 06/24/2018, 8:47 AM

## 2018-06-24 NOTE — Progress Notes (Signed)
Patient ID: Pamela Pratt, female   DOB: [birth year], 27 y.o.   MRN: [id number]  No chief complaint on file.   HPI Pamela Pratt is a 27 y.o. female.  Patient presenting for colposcopy and possible endocervical curettage following abnormal pap smear on 06/08/18. Patient had Pap smear showing ASCUS + HPV. No other records available to see previous pap smears, per previous notes Dr. Cathlean CowerMikell had requested release of records from pap in 2018. Patient reports that she had previous abnormal pap smears a few years ago and had colposcopy performed then.   Indications: Pap smear on July 2019 showed: ASCUS with POSITIVE high risk HPV. Previous colposcopy: unknown results, awaiting release of records. Prior cervical treatment: no treatment.  Past Medical History:  Diagnosis Date  . Abscess of buttock, right 07/2011  . Asthma     Past Surgical History:  Procedure Laterality Date  . BREAST SURGERY  2003   cyst  . COLPOSCOPY  06/02/2011    Family History  Problem Relation Age of Onset  . Diabetes Maternal Grandfather   . Diabetes Maternal Aunt     Social History Social History   Tobacco Use  . Smoking status: Current Every Day Smoker    Packs/day: 0.25    Types: Cigarettes  . Smokeless tobacco: Never Used  Substance Use Topics  . Alcohol use: Yes  . Drug use: No    No Known Allergies  Current Outpatient Medications  Medication Sig Dispense Refill  . Cholecalciferol (VITAMIN D3) 1000 UNITS CAPS Take 1,000 Units by mouth daily at 12 noon.    Marland Kitchen. ibuprofen (ADVIL,MOTRIN) 800 MG tablet Take 1 tablet (800 mg total) by mouth 3 (three) times daily. 21 tablet 0  . levonorgestrel-ethinyl estradiol (KURVELO) 0.15-30 MG-MCG tablet Take 1 tablet by mouth daily. 3 Package 3  . Multiple Vitamin (MULTIVITAMIN WITH MINERALS) TABS Take 1 tablet by mouth daily at 12 noon.     No current facility-administered medications for this visit.     Blood pressure 128/78, pulse 79, temperature 98.8 F (37.1  C), temperature source Oral, weight 135 lb (61.2 kg), last menstrual period 06/01/2018, SpO2 99 %.  Physical Exam Physical Exam  Genitourinary: Vagina normal. Pelvic exam was performed with patient supine. Cervix exhibits no motion tenderness, no discharge and no friability.  Genitourinary Comments: Normal external exam. Neg abnormal vascularization. No acetowhite lesion. Normal lugol's uptake      Data Reviewed 06/24/18  Assessment    Procedure Details  The risks and benefits of the procedure and Written informed consent obtained.  Speculum placed in vagina and excellent visualization of cervix achieved, cervix swabbed x 3 with acetic acid solution.  Specimens: ECC  Complications: none.     Plan    Specimens labelled and sent to Pathology. Return to discuss Pathology results in 2 weeks.      [name] 06/24/2018, 9:12 AM

## 2018-06-29 ENCOUNTER — Telehealth: Payer: Self-pay | Admitting: Family Medicine

## 2018-06-29 NOTE — Telephone Encounter (Signed)
HIPAA compliant message left.  Whenever she calls please let her know that her colposcopy test is negative for cancer.  She need repeat PAP in a year. I will forward message to PCP for follow-up.

## 2018-09-15 ENCOUNTER — Emergency Department (HOSPITAL_COMMUNITY)
Admission: EM | Admit: 2018-09-15 | Discharge: 2018-09-15 | Disposition: A | Discharge status: Home / Self Care | Payer: BLUE CROSS/BLUE SHIELD | Attending: Emergency Medicine | Admitting: Emergency Medicine

## 2018-09-15 ENCOUNTER — Encounter (HOSPITAL_COMMUNITY): Payer: Self-pay | Admitting: Emergency Medicine

## 2018-09-15 ENCOUNTER — Other Ambulatory Visit: Payer: Self-pay

## 2018-09-15 DIAGNOSIS — R6884 Jaw pain: Secondary | ICD-10-CM | POA: Diagnosis not present

## 2018-09-15 DIAGNOSIS — M26621 Arthralgia of right temporomandibular joint: Secondary | ICD-10-CM | POA: Insufficient documentation

## 2018-09-15 DIAGNOSIS — F17210 Nicotine dependence, cigarettes, uncomplicated: Secondary | ICD-10-CM | POA: Diagnosis not present

## 2018-09-15 DIAGNOSIS — R51 Headache: Secondary | ICD-10-CM | POA: Diagnosis not present

## 2018-09-15 DIAGNOSIS — J45909 Unspecified asthma, uncomplicated: Secondary | ICD-10-CM | POA: Diagnosis not present

## 2018-09-15 NOTE — ED Notes (Signed)
ED Provider at bedside. 

## 2018-09-15 NOTE — Discharge Instructions (Addendum)
Please return for any problem. Follow  up with ENT and Ortho as instructed.

## 2018-09-15 NOTE — ED Triage Notes (Signed)
Pt verbalizes "thought I had another ear infection" as pointing to right ear; continues to verbalizes headache for a month.

## 2018-09-15 NOTE — ED Provider Notes (Signed)
[facility]-EMERGENCY DEPT Provider Note   CSN: [id number] Arrival date & time: 09/15/18  0848     History   Chief Complaint Chief Complaint  Patient presents with  . Otalgia  . Headache    HPI Pamela Pratt is a 27 y.o. female.  27 year old female with prior medical history as detailed below presents with complaint of persistent right-sided jaw and headache.  Symptoms have been ongoing for at least the last 3 weeks.  She is concerned that she may have an ear infection.  She denies fever.  She denies visual change.  She reports pain in and around the right TMJ.  She does chew gum on a frequent basis.  She reports increased pain after chewing.  She describes feeling a "clicking" in the right TMJ.  She denies dental pain.  She denies difficulty swallowing.  She denies any other acute complaint.  The history is provided by the patient and medical records.  Otalgia  This is a recurrent problem. The current episode started more than 1 week ago. There is pain in the right ear. The problem occurs constantly. The problem has not changed since onset.There has been no fever. Associated symptoms include headaches.  Headache      Past Medical History:  Diagnosis Date  . Abscess of buttock, right 07/2011  . Asthma     Patient Active Problem List   Diagnosis Date Noted  . Health care maintenance 06/08/2018  . Routine screening for STI (sexually transmitted infection) 06/08/2018  . Birth control 11/23/2017  . Tobacco abuse 07/30/2017    Past Surgical History:  Procedure Laterality Date  . BREAST SURGERY  2003   cyst  . COLPOSCOPY  06/02/2011     OB History   None      Home Medications    Prior to Admission medications   Medication Sig Start Date End Date Taking? Authorizing Provider  Cholecalciferol (VITAMIN D3) 1000 UNITS CAPS Take 1,000 Units by mouth daily at 12 noon.    [provider]  ibuprofen (ADVIL,MOTRIN) 800 MG tablet Take 1  tablet (800 mg total) by mouth 3 (three) times daily. 09/23/15   Elson Areas, PA-C  levonorgestrel-ethinyl estradiol Williemae Natter) 0.15-30 MG-MCG tablet Take 1 tablet by mouth daily. 11/27/17   Marquette Saa, MD  Multiple Vitamin (MULTIVITAMIN WITH MINERALS) TABS Take 1 tablet by mouth daily at 12 noon.    [provider]    Family History Family History  Problem Relation Age of Onset  . Diabetes Maternal Grandfather   . Diabetes Maternal Aunt     Social History Social History   Tobacco Use  . Smoking status: Current Every Day Smoker    Packs/day: 0.25    Types: Cigarettes  . Smokeless tobacco: Never Used  Substance Use Topics  . Alcohol use: Yes  . Drug use: No     Allergies   Patient has no known allergies.   Review of Systems Review of Systems  HENT: Positive for ear pain.   Neurological: Positive for headaches.  All other systems reviewed and are negative.    Physical Exam Updated Vital Signs BP 128/73 (BP Location: Left Arm)   Pulse 68   Temp 98.4 F (36.9 C) (Oral)   Resp 16   LMP 08/16/2018   SpO2 100%   Physical Exam  Constitutional: She is oriented to person, place, and time. She appears well-developed and well-nourished. No distress.  HENT:  Head: Normocephalic and atraumatic.  Mouth/Throat: Oropharynx is clear and moist.  Mild tenderness with palpation overlying the right TMJ.  Bilateral TMs are clear.  No erythema in the bilateral otic canals.  Eyes: Pupils are equal, round, and reactive to light. Conjunctivae and EOM are normal.  Neck: Normal range of motion. Neck supple.  Cardiovascular: Normal rate, regular rhythm and normal heart sounds.  Pulmonary/Chest: Effort normal and breath sounds normal. No respiratory distress.  Abdominal: Soft. She exhibits no distension. There is no tenderness.  Musculoskeletal: Normal range of motion. She exhibits no edema or deformity.  Neurological: She is alert and oriented to person,  place, and time.  Skin: Skin is warm and dry.  Psychiatric: She has a normal mood and affect.  Nursing note and vitals reviewed.    ED Treatments / Results  Labs (all labs ordered are listed, but only abnormal results are displayed) Labs Reviewed - No data to display  EKG None  Radiology No results found.  Procedures Procedures (including critical care time)  Medications Ordered in ED Medications - No data to display   Initial Impression / Assessment and Plan / ED Course  I have reviewed the triage vital signs and the nursing notes.  Pertinent labs & imaging results that were available during my care of the patient were reviewed by me and considered in my medical decision making (see chart for details).     MDM  Screen complete  Patient is presenting for evaluation of right-sided jaw and head pain.  Symptoms are most likely consistent with TMJ syndrome.  No evidence of other acute pathology found on exam or history.  Patient understands and outpatient course of treatment for TMJ syndrome.  Strict return precautions given and understood.  Importance of close follow-up is stressed.  Final Clinical Impressions(s) / ED Diagnoses   Final diagnoses:  Arthralgia of right temporomandibular joint    ED Discharge Orders    None       Wynetta Fines, MD 09/15/18 [phone]

## 2018-11-01 ENCOUNTER — Other Ambulatory Visit: Payer: Self-pay

## 2018-11-03 MED ORDER — LEVONORGESTREL-ETHINYL ESTRAD 0.15-30 MG-MCG PO TABS: 1.0000 | ORAL_TABLET | Freq: Every day | ORAL | 3 refills | Status: DC

## 2018-11-03 NOTE — Addendum Note (Signed)
Addended by: Nigel MormonBRAKE, Akeylah Hendel S on: 11/03/2018 11:46 AM   Modules accepted: Orders

## 2019-03-14 ENCOUNTER — Telehealth (INDEPENDENT_AMBULATORY_CARE_PROVIDER_SITE_OTHER): Payer: BLUE CROSS/BLUE SHIELD | Admitting: Family Medicine

## 2019-03-14 ENCOUNTER — Ambulatory Visit: Payer: BLUE CROSS/BLUE SHIELD

## 2019-03-14 ENCOUNTER — Other Ambulatory Visit: Payer: Self-pay

## 2019-03-14 DIAGNOSIS — B9689 Other specified bacterial agents as the cause of diseases classified elsewhere: Secondary | ICD-10-CM | POA: Diagnosis not present

## 2019-03-14 DIAGNOSIS — N760 Acute vaginitis: Secondary | ICD-10-CM | POA: Diagnosis not present

## 2019-03-14 MED ORDER — METRONIDAZOLE 0.75 % VA GEL: 1.0000 | Freq: Every day | VAGINAL | 1 refills | Status: AC

## 2019-03-14 NOTE — Progress Notes (Signed)
[facility] Telemedicine Visit  Patient consented to have visit conducted via telephone.  Encounter participants: Patient: Pamela Pratt  Provider: Tawanna Cooler Waunita Sandstrom  Others (if applicable): none   Chief Complaint: vaginal odor  HPI:  Fishy bodily smell partiuclarly evident after showering. Onset several days ago. Stable course.   PMH: Similar presentations in past treated successfully for BV with an intravaginal gel.  LOng whle back she reports frequent BV and yeast No vagianl discharge, itching, pain (+) safe sex practice  SH: Smokes ROS: No fever, no suprapubic pain, no dysuria  Pertinent PMHx: Hx of BV  Exam:  Respiratory:Respiratory: speaking in full sentence, no audible wheeze, voice prosodic   Assessment/Plan:  1) Presumed Bacterial Vaginosis      - Rx metronidazle gel IV qhs x 5 days Call if condition worsens or fails to improve.   Time spent on phone with patient: 10 minutes

## 2019-03-23 ENCOUNTER — Other Ambulatory Visit: Payer: Self-pay

## 2019-03-23 ENCOUNTER — Encounter (HOSPITAL_BASED_OUTPATIENT_CLINIC_OR_DEPARTMENT_OTHER): Payer: Self-pay | Admitting: *Deleted

## 2019-03-23 ENCOUNTER — Emergency Department (HOSPITAL_BASED_OUTPATIENT_CLINIC_OR_DEPARTMENT_OTHER)
Admission: EM | Admit: 2019-03-23 | Discharge: 2019-03-24 | Disposition: A | Discharge status: Home / Self Care | Payer: BLUE CROSS/BLUE SHIELD | Attending: Emergency Medicine | Admitting: Emergency Medicine

## 2019-03-23 ENCOUNTER — Emergency Department (HOSPITAL_BASED_OUTPATIENT_CLINIC_OR_DEPARTMENT_OTHER): Payer: BLUE CROSS/BLUE SHIELD

## 2019-03-23 DIAGNOSIS — F17210 Nicotine dependence, cigarettes, uncomplicated: Secondary | ICD-10-CM | POA: Diagnosis not present

## 2019-03-23 DIAGNOSIS — R0789 Other chest pain: Secondary | ICD-10-CM | POA: Insufficient documentation

## 2019-03-23 DIAGNOSIS — R110 Nausea: Secondary | ICD-10-CM | POA: Diagnosis not present

## 2019-03-23 DIAGNOSIS — R350 Frequency of micturition: Secondary | ICD-10-CM | POA: Diagnosis not present

## 2019-03-23 DIAGNOSIS — Z79899 Other long term (current) drug therapy: Secondary | ICD-10-CM | POA: Diagnosis not present

## 2019-03-23 DIAGNOSIS — J45909 Unspecified asthma, uncomplicated: Secondary | ICD-10-CM | POA: Insufficient documentation

## 2019-03-23 DIAGNOSIS — B9689 Other specified bacterial agents as the cause of diseases classified elsewhere: Secondary | ICD-10-CM | POA: Diagnosis not present

## 2019-03-23 DIAGNOSIS — N760 Acute vaginitis: Secondary | ICD-10-CM | POA: Insufficient documentation

## 2019-03-23 DIAGNOSIS — K5909 Other constipation: Secondary | ICD-10-CM | POA: Diagnosis not present

## 2019-03-23 DIAGNOSIS — R52 Pain, unspecified: Secondary | ICD-10-CM | POA: Diagnosis not present

## 2019-03-23 LAB — WET PREP, GENITAL
Sperm: NONE SEEN
Trich, Wet Prep: NONE SEEN
Yeast Wet Prep HPF POC: NONE SEEN

## 2019-03-23 LAB — COMPREHENSIVE METABOLIC PANEL
ALT: 38 U/L (ref 0–44)
AST: 21 U/L (ref 15–41)
Albumin: 3.9 g/dL (ref 3.5–5.0)
Alkaline Phosphatase: 24 U/L — ABNORMAL LOW (ref 38–126)
Anion gap: 8 (ref 5–15)
BUN: 11 mg/dL (ref 6–20)
CO2: 19 mmol/L — ABNORMAL LOW (ref 22–32)
Calcium: 8.8 mg/dL — ABNORMAL LOW (ref 8.9–10.3)
Chloride: 108 mmol/L (ref 98–111)
Creatinine, Ser: 0.9 mg/dL (ref 0.44–1.00)
GFR calc Af Amer: >60 mL/min (ref >60)
GFR calc non Af Amer: >60 mL/min (ref >60)
Glucose, Bld: 102 mg/dL — ABNORMAL HIGH (ref 70–99)
Potassium: 3.4 mmol/L — ABNORMAL LOW (ref 3.5–5.1)
Sodium: 135 mmol/L (ref 135–145)
Total Bilirubin: 0.8 mg/dL (ref 0.3–1.2)
Total Protein: 6.7 g/dL (ref 6.5–8.1)

## 2019-03-23 LAB — URINALYSIS, ROUTINE W REFLEX MICROSCOPIC
Bilirubin Urine: NEGATIVE
Glucose, UA: NEGATIVE mg/dL
Hgb urine dipstick: NEGATIVE
Ketones, ur: NEGATIVE mg/dL
Leukocytes,Ua: NEGATIVE
Nitrite: NEGATIVE
Protein, ur: NEGATIVE mg/dL
Specific Gravity, Urine: <1.005 — ABNORMAL LOW (ref 1.005–1.030)
pH: 6.5 (ref 5.0–8.0)

## 2019-03-23 LAB — CBC WITH DIFFERENTIAL/PLATELET
Abs Immature Granulocytes: 0.01 10*3/uL (ref 0.00–0.07)
Basophils Absolute: 0 10*3/uL (ref 0.0–0.1)
Basophils Relative: 0 %
Eosinophils Absolute: 0 10*3/uL (ref 0.0–0.5)
Eosinophils Relative: 0 %
HCT: 39.1 % (ref 36.0–46.0)
Hemoglobin: 12.7 g/dL (ref 12.0–15.0)
Immature Granulocytes: 0 %
Lymphocytes Relative: 32 %
Lymphs Abs: 2.2 10*3/uL (ref 0.7–4.0)
MCH: 29.3 pg (ref 26.0–34.0)
MCHC: 32.5 g/dL (ref 30.0–36.0)
MCV: 90.3 fL (ref 80.0–100.0)
Monocytes Absolute: 0.5 10*3/uL (ref 0.1–1.0)
Monocytes Relative: 7 %
Neutro Abs: 4.1 10*3/uL (ref 1.7–7.7)
Neutrophils Relative %: 61 %
Platelets: 332 10*3/uL (ref 150–400)
RBC: 4.33 MIL/uL (ref 3.87–5.11)
RDW: 13.1 % (ref 11.5–15.5)
WBC: 6.8 10*3/uL (ref 4.0–10.5)
nRBC: 0 % (ref 0.0–0.2)

## 2019-03-23 LAB — PREGNANCY, URINE: Preg Test, Ur: NEGATIVE

## 2019-03-23 MED ORDER — ALBUTEROL SULFATE HFA 108 (90 BASE) MCG/ACT IN AERS: 2.0000 | INHALATION_SPRAY | Freq: Once | RESPIRATORY_TRACT | Status: DC

## 2019-03-23 MED ORDER — METRONIDAZOLE 500 MG PO TABS: 500.0000 mg | ORAL_TABLET | Freq: Two times a day (BID) | ORAL | 0 refills | Status: DC

## 2019-03-23 MED ORDER — POLYETHYLENE GLYCOL 3350 17 GM/SCOOP PO POWD: 1.0000 | Freq: Once | ORAL | 0 refills | Status: AC

## 2019-03-23 MED ORDER — ALBUTEROL SULFATE HFA 108 (90 BASE) MCG/ACT IN AERS: 1.0000 | INHALATION_SPRAY | Freq: Four times a day (QID) | RESPIRATORY_TRACT | 0 refills | Status: AC | PRN

## 2019-03-23 MED ORDER — ONDANSETRON HCL 4 MG PO TABS: 4.0000 mg | ORAL_TABLET | Freq: Four times a day (QID) | ORAL | 0 refills | Status: DC

## 2019-03-23 NOTE — ED Triage Notes (Addendum)
Pt c/o generalized body aches and fever x 1 day pt also urinary freq and vaginal discharge

## 2019-03-23 NOTE — ED Provider Notes (Signed)
[facility] EMERGENCY DEPARTMENT Provider Note   CSN: [id number] Arrival date & time: 03/23/19  2025    History   Chief Complaint Chief Complaint  Patient presents with   Generalized Body Aches    HPI Pamela Pratt is a 28 y.o. female with history of asthma who presents with a 1 day history of generalized body aches and chills and a several week history of vaginal discharge.  Patient believes it is BV, she has not had it before.  She reports her doctor called in a Flagyl "cream", but she could not afford it.  She has had urinary frequency.  She has had some low back pain.  She has had some lower abdominal cramping.  She has had nausea, but no vomiting.  She denies any diarrhea.  She denies any chest pain.  Patient reports he has had a little bit of "breathing funny," but denies any shortness of breath or cough.  She also denies any sore throat or ear pain.  Patient denies any documented fever at home.  She has no concern for STD exposure.     HPI  Past Medical History:  Diagnosis Date   Abscess of buttock, right 07/2011   Asthma     Patient Active Problem List   Diagnosis Date Noted   Health care maintenance 06/08/2018   Birth control 11/23/2017   Tobacco abuse 07/30/2017    Past Surgical History:  Procedure Laterality Date   BREAST SURGERY  2003   cyst   COLPOSCOPY  06/02/2011     OB History   No obstetric history on file.      Home Medications    Prior to Admission medications   Medication Sig Start Date End Date Taking? Authorizing Provider  albuterol (PROVENTIL HFA;VENTOLIN HFA) 108 (90 Base) MCG/ACT inhaler Inhale 1-2 puffs into the lungs every 6 (six) hours as needed for wheezing or shortness of breath. 03/23/19   Jacquelynn Friend, Waylan BogaAlexandra M, PA-C  Cholecalciferol (VITAMIN D3) 1000 UNITS CAPS Take 1,000 Units by mouth daily at 12 noon.    [provider]  ibuprofen (ADVIL,MOTRIN) 800 MG tablet Take 1 tablet (800 mg total) by mouth 3 (three)  times daily. 09/23/15   Elson AreasSofia, Leslie K, PA-C  levonorgestrel-ethinyl estradiol Williemae Natter(KURVELO) 0.15-30 MG-MCG tablet Take 1 tablet by mouth daily. 11/03/18   Allayne StackBeard, Samantha N, DO  metroNIDAZOLE (FLAGYL) 500 MG tablet Take 1 tablet (500 mg total) by mouth 2 (two) times daily. 03/23/19   Emi HolesLaw, Gabrielle Mester M, PA-C  Multiple Vitamin (MULTIVITAMIN WITH MINERALS) TABS Take 1 tablet by mouth daily at 12 noon.    [provider]  ondansetron (ZOFRAN) 4 MG tablet Take 1 tablet (4 mg total) by mouth every 6 (six) hours. 03/23/19   Emi HolesLaw, Tyree Fluharty M, PA-C    Family History Family History  Problem Relation Age of Onset   Diabetes Maternal Grandfather    Diabetes Maternal Aunt     Social History Social History   Tobacco Use   Smoking status: Current Every Day Smoker    Packs/day: 0.50    Types: Cigarettes   Smokeless tobacco: Never Used  Substance Use Topics   Alcohol use: Yes   Drug use: No     Allergies   Patient has no known allergies.   Review of Systems Review of Systems  Constitutional: Positive for chills. Negative for fever.  HENT: Negative for congestion, facial swelling and sore throat.   Respiratory: Negative for cough and shortness of breath.  Cardiovascular: Negative for chest pain.  Gastrointestinal: Negative for abdominal pain, nausea and vomiting.  Genitourinary: Positive for frequency and vaginal discharge. Negative for dysuria and vaginal bleeding.  Musculoskeletal: Positive for back pain (low back) and myalgias.  Skin: Negative for rash and wound.  Neurological: Negative for headaches.  Psychiatric/Behavioral: The patient is not nervous/anxious.      Physical Exam Updated Vital Signs BP 132/74    Pulse 84    Temp 99 F (37.2 C)    Resp 17    Ht 5\' 4"  (1.626 m)    Wt 61.2 kg    LMP 03/09/2019    SpO2 99%    BMI 23.17 kg/m   Physical Exam Vitals signs and nursing note reviewed.  Constitutional:      General: She is not in acute distress.     Appearance: She is well-developed. She is not diaphoretic.  HENT:     Head: Normocephalic and atraumatic.     Right Ear: Tympanic membrane normal.     Left Ear: Tympanic membrane normal.     Mouth/Throat:     Pharynx: No oropharyngeal exudate.  Eyes:     General: No scleral icterus.       Right eye: No discharge.        Left eye: No discharge.     Conjunctiva/sclera: Conjunctivae normal.     Pupils: Pupils are equal, round, and reactive to light.  Neck:     Musculoskeletal: Normal range of motion and neck supple.     Thyroid: No thyromegaly.  Cardiovascular:     Rate and Rhythm: Normal rate and regular rhythm.     Heart sounds: Normal heart sounds. No murmur. No friction rub. No gallop.   Pulmonary:     Effort: Pulmonary effort is normal. No respiratory distress.     Breath sounds: Normal breath sounds. No stridor. No wheezing or rales.  Abdominal:     General: Bowel sounds are normal. There is no distension.     Palpations: Abdomen is soft.     Tenderness: There is no abdominal tenderness. There is no right CVA tenderness, left CVA tenderness, guarding or rebound.  Musculoskeletal:     Comments: No tenderness on palpation of the spine or paraspinal regions  Lymphadenopathy:     Cervical: No cervical adenopathy.  Skin:    General: Skin is warm and dry.     Coloration: Skin is not pale.     Findings: No rash.  Neurological:     Mental Status: She is alert.     Coordination: Coordination normal.      ED Treatments / Results  Labs (all labs ordered are listed, but only abnormal results are displayed) Labs Reviewed  WET PREP, GENITAL - Abnormal; Notable for the following components:      Result Value   Clue Cells Wet Prep HPF POC PRESENT (*)    WBC, Wet Prep HPF POC MODERATE (*)    All other components within normal limits  URINALYSIS, ROUTINE W REFLEX MICROSCOPIC - Abnormal; Notable for the following components:   Specific Gravity, Urine <1.005 (*)    All other  components within normal limits  COMPREHENSIVE METABOLIC PANEL - Abnormal; Notable for the following components:   Potassium 3.4 (*)    CO2 19 (*)    Glucose, Bld 102 (*)    Calcium 8.8 (*)    Alkaline Phosphatase 24 (*)    All other components within normal limits  PREGNANCY, URINE  CBC  WITH DIFFERENTIAL/PLATELET  GC/CHLAMYDIA PROBE AMP ([place]) NOT AT [facility]    EKG None  Radiology Dg Abdomen Acute W/chest  Result Date: 03/23/2019 CLINICAL DATA:  Chest tightness for several hours EXAM: DG ABDOMEN ACUTE W/ 1V CHEST COMPARISON:  None. FINDINGS: Cardiac shadows within normal limits. The lungs are clear bilaterally. No acute bony abnormality is noted. Scattered large and small bowel gas is noted. No free air is noted. No obstructive changes are seen. No bony abnormality is noted. IMPRESSION: No acute abnormality seen. Electronically Signed   By: Alcide Clever M.D.   On: 03/23/2019 23:00    Procedures Procedures (including critical care time)  Medications Ordered in ED Medications - No data to display   Initial Impression / Assessment and Plan / ED Course  I have reviewed the triage vital signs and the nursing notes.  Pertinent labs & imaging results that were available during my care of the patient were reviewed by me and considered in my medical decision making (see chart for details).        Patient presenting with a several week history of urinary frequency and vaginal discharge.  She also reports a one-week history of chest tightness, nausea, bloating, and body aches.  She seems to be worried she may have COVID-19.  I feel this is unlikely.  She denies any documented fever.  She denies any cough, shortness of breath.  Labs are unremarkable.  UA is negative.  Wet prep shows BV.  GC/chlamydia sent and pending.  There is no adnexal tenderness or CMT concerning for PID.  Abdomen is nontender.  Chest x-ray with abdomen negative.  Patient we discharged home with Flagyl, albuterol  inhaler, MiraLAX, Zofran.  Return precautions discussed.  Patient understands and agrees with plan.  Patient vital stable throughout ED course and discharged in satisfactory condition.  Pamela Pratt was evaluated in Emergency Department on 03/24/2019 for the symptoms described in the history of present illness. She was evaluated in the context of the global COVID-19 pandemic, which necessitated consideration that the patient might be at risk for infection with the SARS-CoV-2 virus that causes COVID-19. Institutional protocols and algorithms that pertain to the evaluation of patients at risk for COVID-19 are in a state of rapid change based on information released by regulatory bodies including the CDC and federal and state organizations. These policies and algorithms were followed during the patient's care in the ED.   Final Clinical Impressions(s) / ED Diagnoses   Final diagnoses:  BV (bacterial vaginosis)  Chest tightness  Nausea  Other constipation  Urinary frequency    ED Discharge Orders         Ordered    albuterol (PROVENTIL HFA;VENTOLIN HFA) 108 (90 Base) MCG/ACT inhaler  Every 6 hours PRN     03/23/19 2315    polyethylene glycol powder (MIRALAX) 17 GM/SCOOP powder   Once     03/23/19 2315    ondansetron (ZOFRAN) 4 MG tablet  Every 6 hours     03/23/19 2315    metroNIDAZOLE (FLAGYL) 500 MG tablet  2 times daily     03/23/19 2315           Emi Holes, PA-C 03/24/19 0016    Arby Barrette, MD 03/26/19 1844

## 2019-03-23 NOTE — ED Notes (Signed)
Pt reports having BV and being sent flagyl as a cream but unable to afford that medication. Pt requesting a prescription for pill form.

## 2019-03-23 NOTE — Discharge Instructions (Addendum)
Use albuterol inhaler every 6 hours as needed for chest tightness.  Take MiraLAX once daily until you are having regular bowel movements.  Mix this medication with water, juice, or Gatorade.  Take Zofran every 6 hours as needed for nausea or vomiting.  Take Flagyl until completed for bacterial vaginosis.  Do not drink alcohol while taking this medication.  You will be called if you test positive for gonorrhea or chlamydia.  Please return the emergency department you develop any new or worsening symptoms including persistent fever, severe shortness of breath, passing out, localized abdominal pain, or any other new or concerning symptoms.

## 2019-03-23 NOTE — ED Notes (Signed)
Pt reports no bowel movement x 3 days- reports nausea

## 2019-03-23 NOTE — ED Notes (Signed)
Patient transported to X-ray 

## 2019-03-28 LAB — GC/CHLAMYDIA PROBE AMP (~~LOC~~) NOT AT ARMC
Chlamydia: NEGATIVE
Neisseria Gonorrhea: NEGATIVE

## 2019-04-19 ENCOUNTER — Telehealth: Payer: Self-pay | Admitting: Family Medicine

## 2019-04-19 NOTE — Telephone Encounter (Signed)
**  After Hours/ Emergency Line Call**  Received a call to report that [name] was interested in her diagnosis from her ED visit in April.  She reports that she is calling because her partner believes that she is sleeping with other men.  She is asking how you get BV and what are the ways that it could be transmitted.  Patient is curious if this is considered an STI.  Told patient that this is an emergency line and that BV is not considered an STI but that this is an inappropriate call for an after hours emergency line.  Recommended that in the future she call during regular business hours.  Patient again asked me if BV was an STI and once I said no but that you could possibly get it from multiple partners among many other causes, she then hung up the phone.  Will forward to PCP.  [place] Macey Wurtz, DO PGY-2, [facility] 04/19/2019 6:13 PM

## 2019-04-22 NOTE — Telephone Encounter (Signed)
Call patient to discuss results from ED visit in April in response to recent emergency line call.  Let her know that her gonorrhea, chlamydia, and trich tests were negative in 03/2019.  Also discussed that she was diagnosed with bacterial vaginosis at that time and it is not considered a sexually transmitted disease, however often occurs in females that are sexually active.  Discussed ways to help prevent this recurring in the future including working towards quitting smoking, avoiding douching, good hygiene (talked about gentle soaps including Dove and only in external vaginal area), and can always try eating yogurt/probiotics on a daily basis.  Lastly, let her know that she would be due for a Pap smear in July and that we could always retest STIs at that time if needed.  Patient states symptoms are resolved at this time and will try to do these preventative measures.  Will be making an appointment for July or sooner if needed.   Allayne Stack, DO

## 2019-06-21 ENCOUNTER — Telehealth: Payer: Self-pay | Admitting: *Deleted

## 2019-06-21 NOTE — Telephone Encounter (Signed)
Pt is having discharge and wanted a refill of fluconazole.  Advised appt would need to be made.  Pt declined a this time due to insurance.  States that she "will wait it out".  Advised to try monistat.  Pt will check this out. Christen Bame, CMA

## 2019-12-12 ENCOUNTER — Other Ambulatory Visit: Payer: Self-pay | Admitting: Family Medicine

## 2020-06-19 LAB — OB RESULTS CONSOLE RUBELLA ANTIBODY, IGM: Rubella: IMMUNE

## 2020-06-19 LAB — OB RESULTS CONSOLE GC/CHLAMYDIA
Chlamydia: NEGATIVE
Gonorrhea: NEGATIVE

## 2020-06-19 LAB — OB RESULTS CONSOLE ABO/RH: RH Type: POSITIVE

## 2020-06-19 LAB — OB RESULTS CONSOLE VARICELLA ZOSTER ANTIBODY, IGG: Varicella: IMMUNE

## 2020-06-19 LAB — OB RESULTS CONSOLE HEPATITIS B SURFACE ANTIGEN: Hepatitis B Surface Ag: NEGATIVE

## 2020-06-19 LAB — OB RESULTS CONSOLE HIV ANTIBODY (ROUTINE TESTING): HIV: NONREACTIVE

## 2020-10-18 ENCOUNTER — Inpatient Hospital Stay (HOSPITAL_COMMUNITY): Admit: 2020-10-18 | Payer: No Typology Code available for payment source | Admitting: Obstetrics and Gynecology

## 2020-11-24 ENCOUNTER — Observation Stay (HOSPITAL_COMMUNITY)
Admission: EM | Admit: 2020-11-24 | Discharge: 2020-11-25 | Disposition: A | Discharge status: Home / Self Care | Payer: No Typology Code available for payment source | Attending: Obstetrics and Gynecology | Admitting: Obstetrics and Gynecology

## 2020-11-24 ENCOUNTER — Emergency Department (HOSPITAL_COMMUNITY): Payer: No Typology Code available for payment source

## 2020-11-24 ENCOUNTER — Encounter (HOSPITAL_COMMUNITY): Payer: Self-pay | Admitting: *Deleted

## 2020-11-24 ENCOUNTER — Other Ambulatory Visit: Payer: Self-pay

## 2020-11-24 ENCOUNTER — Observation Stay (HOSPITAL_COMMUNITY): Admit: 2020-11-24 | Payer: No Typology Code available for payment source | Admitting: Obstetrics and Gynecology

## 2020-11-24 DIAGNOSIS — R1030 Lower abdominal pain, unspecified: Secondary | ICD-10-CM | POA: Insufficient documentation

## 2020-11-24 DIAGNOSIS — S161XXA Strain of muscle, fascia and tendon at neck level, initial encounter: Secondary | ICD-10-CM | POA: Insufficient documentation

## 2020-11-24 DIAGNOSIS — Z79899 Other long term (current) drug therapy: Secondary | ICD-10-CM | POA: Diagnosis not present

## 2020-11-24 DIAGNOSIS — O9A213 Injury, poisoning and certain other consequences of external causes complicating pregnancy, third trimester: Secondary | ICD-10-CM | POA: Diagnosis present

## 2020-11-24 DIAGNOSIS — F17210 Nicotine dependence, cigarettes, uncomplicated: Secondary | ICD-10-CM | POA: Diagnosis not present

## 2020-11-24 DIAGNOSIS — Y92410 Unspecified street and highway as the place of occurrence of the external cause: Secondary | ICD-10-CM | POA: Insufficient documentation

## 2020-11-24 DIAGNOSIS — O99333 Smoking (tobacco) complicating pregnancy, third trimester: Secondary | ICD-10-CM | POA: Diagnosis not present

## 2020-11-24 DIAGNOSIS — J45909 Unspecified asthma, uncomplicated: Secondary | ICD-10-CM | POA: Diagnosis not present

## 2020-11-24 DIAGNOSIS — Z20822 Contact with and (suspected) exposure to covid-19: Secondary | ICD-10-CM | POA: Diagnosis not present

## 2020-11-24 DIAGNOSIS — Z3A32 32 weeks gestation of pregnancy: Secondary | ICD-10-CM | POA: Diagnosis not present

## 2020-11-24 LAB — URINALYSIS, ROUTINE W REFLEX MICROSCOPIC
Bacteria, UA: NONE SEEN
Bilirubin Urine: NEGATIVE
Glucose, UA: 50 mg/dL — AB
Ketones, ur: NEGATIVE mg/dL
Leukocytes,Ua: NEGATIVE
Nitrite: NEGATIVE
Protein, ur: NEGATIVE mg/dL
Specific Gravity, Urine: 1.02 (ref 1.005–1.030)
pH: 6 (ref 5.0–8.0)

## 2020-11-24 LAB — CBC WITH DIFFERENTIAL/PLATELET
Abs Immature Granulocytes: 0.11 10*3/uL — ABNORMAL HIGH (ref 0.00–0.07)
Basophils Absolute: 0 10*3/uL (ref 0.0–0.1)
Basophils Relative: 0 %
Eosinophils Absolute: 0 10*3/uL (ref 0.0–0.5)
Eosinophils Relative: 0 %
HCT: 34.8 % — ABNORMAL LOW (ref 36.0–46.0)
Hemoglobin: 10.8 g/dL — ABNORMAL LOW (ref 12.0–15.0)
Immature Granulocytes: 1 %
Lymphocytes Relative: 23 %
Lymphs Abs: 1.9 10*3/uL (ref 0.7–4.0)
MCH: 27.3 pg (ref 26.0–34.0)
MCHC: 31 g/dL (ref 30.0–36.0)
MCV: 87.9 fL (ref 80.0–100.0)
Monocytes Absolute: 0.7 10*3/uL (ref 0.1–1.0)
Monocytes Relative: 8 %
Neutro Abs: 5.5 10*3/uL (ref 1.7–7.7)
Neutrophils Relative %: 68 %
Platelets: 283 10*3/uL (ref 150–400)
RBC: 3.96 MIL/uL (ref 3.87–5.11)
RDW: 13.5 % (ref 11.5–15.5)
WBC: 8.3 10*3/uL (ref 4.0–10.5)
nRBC: 0 % (ref 0.0–0.2)

## 2020-11-24 LAB — RAPID URINE DRUG SCREEN, HOSP PERFORMED
Amphetamines: NOT DETECTED
Barbiturates: NOT DETECTED
Benzodiazepines: NOT DETECTED
Cocaine: NOT DETECTED
Opiates: NOT DETECTED
Tetrahydrocannabinol: NOT DETECTED

## 2020-11-24 LAB — ETHANOL: Alcohol, Ethyl (B): <10 mg/dL (ref <10)

## 2020-11-24 LAB — COMPREHENSIVE METABOLIC PANEL
ALT: 19 U/L (ref 0–44)
AST: 19 U/L (ref 15–41)
Albumin: 2.9 g/dL — ABNORMAL LOW (ref 3.5–5.0)
Alkaline Phosphatase: 53 U/L (ref 38–126)
Anion gap: 9 (ref 5–15)
BUN: 11 mg/dL (ref 6–20)
CO2: 19 mmol/L — ABNORMAL LOW (ref 22–32)
Calcium: 9 mg/dL (ref 8.9–10.3)
Chloride: 107 mmol/L (ref 98–111)
Creatinine, Ser: 0.72 mg/dL (ref 0.44–1.00)
GFR, Estimated: >60 mL/min (ref >60)
Glucose, Bld: 93 mg/dL (ref 70–99)
Potassium: 3.7 mmol/L (ref 3.5–5.1)
Sodium: 135 mmol/L (ref 135–145)
Total Bilirubin: 0.7 mg/dL (ref 0.3–1.2)
Total Protein: 5.6 g/dL — ABNORMAL LOW (ref 6.5–8.1)

## 2020-11-24 LAB — I-STAT BETA HCG BLOOD, ED (MC, WL, AP ONLY): I-stat hCG, quantitative: >2000 m[IU]/mL — ABNORMAL HIGH (ref <5)

## 2020-11-24 LAB — PROTIME-INR
INR: 1 (ref 0.8–1.2)
Prothrombin Time: 12.4 seconds (ref 11.4–15.2)

## 2020-11-24 MED ORDER — LACTATED RINGERS IV SOLN: INTRAVENOUS | Status: DC
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
## 2020-11-24 NOTE — Progress Notes (Signed)
Patient is cleared from trama in ED. To transfer to MAU for further FHR monitoring.

## 2020-11-25 ENCOUNTER — Encounter (HOSPITAL_COMMUNITY): Payer: Self-pay | Admitting: Obstetrics and Gynecology

## 2020-11-25 ENCOUNTER — Observation Stay (HOSPITAL_BASED_OUTPATIENT_CLINIC_OR_DEPARTMENT_OTHER): Payer: No Typology Code available for payment source

## 2020-11-25 DIAGNOSIS — O4703 False labor before 37 completed weeks of gestation, third trimester: Secondary | ICD-10-CM

## 2020-11-25 DIAGNOSIS — Z3A32 32 weeks gestation of pregnancy: Secondary | ICD-10-CM

## 2020-11-25 DIAGNOSIS — O99891 Other specified diseases and conditions complicating pregnancy: Secondary | ICD-10-CM

## 2020-11-25 LAB — TYPE AND SCREEN
ABO/RH(D): A POS
Antibody Screen: NEGATIVE

## 2020-11-25 LAB — RESP PANEL BY RT-PCR (FLU A&B, COVID) ARPGX2
Influenza A by PCR: NEGATIVE
Influenza B by PCR: NEGATIVE
SARS Coronavirus 2 by RT PCR: NEGATIVE

## 2020-11-25 MED ORDER — PRENATAL MULTIVITAMIN CH: 1.0000 | ORAL_TABLET | Freq: Every day | ORAL | Status: DC

## 2020-11-25 MED ORDER — CALCIUM CARBONATE ANTACID 500 MG PO CHEW: 2.0000 | CHEWABLE_TABLET | ORAL | Status: DC | PRN

## 2020-11-25 MED ORDER — DOCUSATE SODIUM 100 MG PO CAPS
100.0000 mg | ORAL_CAPSULE | Freq: Every day | ORAL | Status: DC
  Administered 2020-11-25: 100 mg via ORAL
  Filled 2020-11-25: qty 1

## 2020-11-25 MED ORDER — LACTATED RINGERS IV SOLN: INTRAVENOUS | Status: DC

## 2020-11-25 MED ORDER — ZOLPIDEM TARTRATE 5 MG PO TABS: 5.0000 mg | ORAL_TABLET | Freq: Every evening | ORAL | Status: DC | PRN

## 2020-11-25 MED ORDER — ACETAMINOPHEN 500 MG PO TABS: 1000.0000 mg | ORAL_TABLET | Freq: Four times a day (QID) | ORAL | Status: DC | PRN

## 2020-11-25 MED ORDER — ACETAMINOPHEN 325 MG PO TABS
650.0000 mg | ORAL_TABLET | ORAL | Status: DC | PRN
  Administered 2020-11-25: 650 mg via ORAL
  Filled 2020-11-25: qty 2
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
## 2020-12-08 NOTE — L&D Delivery Note (Signed)
DELIVERY NOTE  Pt complete and at +2 station with urge to push. Epidural controlling pain. Pt pushed and delivered a viable female infant in compound position with LUE. Anterior and posterior shoulders spontaneously delivered with next two pushes; body easily followed next. Body cord around feet.Infant placed on mothers abdomen and bulb suction of mouth and nose performed. Poor color, no loud cord. Cord was then quicjly clamped and cut by MD and handed to NICU present (for meconium); good cry noted after deep suctioning. Cord blood obtained, 3VC. Baby had a vigorous spontaneous cry noted. Placenta then delivered at 0137 intact. Brisk bleeding noted, LUS atony. TXA plus IM methergine given as wlel as bimanual massage, bleeding improved substantiatlly, EBL 400cc. Fundal massage performed and pitocin per protocol. Fundus firm. The following lacerations were noted: none.  Counts correct   Infant time: 0131 Gender: female Placenta time: 0137 Apgars: 7/8 Weight: pending skin-to-skin

## 2020-12-19 LAB — OB RESULTS CONSOLE GBS: GBS: NEGATIVE

## 2021-01-08 ENCOUNTER — Telehealth (HOSPITAL_COMMUNITY): Payer: Self-pay | Admitting: *Deleted

## 2021-01-08 NOTE — Telephone Encounter (Signed)
Preadmission screen  

## 2021-01-09 ENCOUNTER — Encounter (HOSPITAL_COMMUNITY): Payer: Self-pay | Admitting: *Deleted

## 2021-01-09 ENCOUNTER — Telehealth (HOSPITAL_COMMUNITY): Payer: Self-pay | Admitting: *Deleted

## 2021-01-09 NOTE — Telephone Encounter (Signed)
Preadmission screen  

## 2021-01-12 ENCOUNTER — Other Ambulatory Visit (HOSPITAL_COMMUNITY)
Admission: RE | Admit: 2021-01-12 | Discharge: 2021-01-12 | Disposition: A | Discharge status: Home / Self Care | Payer: No Typology Code available for payment source | Source: Ambulatory Visit | Attending: Obstetrics and Gynecology | Admitting: Obstetrics and Gynecology

## 2021-01-12 NOTE — Progress Notes (Signed)
Patient test + for Covid 12/20/20 at the [facility], she has results on her phone.  She will have show results on day of procedure.  Per [facility] policy no retest within 90 days of Covid + result.  Ok to proceed with surgery/procedure.

## 2021-01-13 ENCOUNTER — Other Ambulatory Visit: Payer: Self-pay | Admitting: Obstetrics and Gynecology

## 2021-01-14 ENCOUNTER — Inpatient Hospital Stay (HOSPITAL_COMMUNITY)
Admission: AD | Admit: 2021-01-14 | Discharge: 2021-01-16 | DRG: 807 | Disposition: A | Discharge status: Home / Self Care | Payer: No Typology Code available for payment source | Attending: Obstetrics and Gynecology | Admitting: Obstetrics and Gynecology

## 2021-01-14 ENCOUNTER — Inpatient Hospital Stay (HOSPITAL_COMMUNITY): Payer: No Typology Code available for payment source | Admitting: Anesthesiology

## 2021-01-14 ENCOUNTER — Encounter (HOSPITAL_COMMUNITY): Payer: Self-pay | Admitting: Obstetrics and Gynecology

## 2021-01-14 ENCOUNTER — Other Ambulatory Visit: Payer: Self-pay

## 2021-01-14 ENCOUNTER — Inpatient Hospital Stay (HOSPITAL_COMMUNITY): Payer: No Typology Code available for payment source

## 2021-01-14 DIAGNOSIS — J45909 Unspecified asthma, uncomplicated: Secondary | ICD-10-CM | POA: Diagnosis present

## 2021-01-14 DIAGNOSIS — Z3A39 39 weeks gestation of pregnancy: Secondary | ICD-10-CM | POA: Diagnosis not present

## 2021-01-14 DIAGNOSIS — O9952 Diseases of the respiratory system complicating childbirth: Secondary | ICD-10-CM | POA: Diagnosis present

## 2021-01-14 DIAGNOSIS — Z87891 Personal history of nicotine dependence: Secondary | ICD-10-CM | POA: Diagnosis not present

## 2021-01-14 DIAGNOSIS — O26893 Other specified pregnancy related conditions, third trimester: Secondary | ICD-10-CM | POA: Diagnosis present

## 2021-01-14 DIAGNOSIS — Z8616 Personal history of COVID-19: Secondary | ICD-10-CM | POA: Diagnosis not present

## 2021-01-14 LAB — CBC
HCT: 36.8 % (ref 36.0–46.0)
Hemoglobin: 12.2 g/dL (ref 12.0–15.0)
MCH: 28.4 pg (ref 26.0–34.0)
MCHC: 33.2 g/dL (ref 30.0–36.0)
MCV: 85.8 fL (ref 80.0–100.0)
Platelets: 279 10*3/uL (ref 150–400)
RBC: 4.29 MIL/uL (ref 3.87–5.11)
RDW: 15.6 % — ABNORMAL HIGH (ref 11.5–15.5)
WBC: 8.6 10*3/uL (ref 4.0–10.5)
nRBC: 0 % (ref 0.0–0.2)

## 2021-01-14 LAB — TYPE AND SCREEN
ABO/RH(D): A POS
Antibody Screen: NEGATIVE

## 2021-01-14 MED ORDER — LACTATED RINGERS IV SOLN
500.0000 mL | Freq: Once | INTRAVENOUS | Status: AC
  Administered 2021-01-14: 500 mL via INTRAVENOUS

## 2021-01-14 MED ORDER — TERBUTALINE SULFATE 1 MG/ML IJ SOLN
INTRAMUSCULAR | Status: AC
  Administered 2021-01-14: 0.25 mg via SUBCUTANEOUS
  Filled 2021-01-14: qty 1

## 2021-01-14 MED ORDER — LACTATED RINGERS IV SOLN: INTRAVENOUS | Status: DC

## 2021-01-14 MED ORDER — ACETAMINOPHEN 325 MG PO TABS: 650.0000 mg | ORAL_TABLET | ORAL | Status: DC | PRN

## 2021-01-14 MED ORDER — FENTANYL-BUPIVACAINE-NACL 0.5-0.125-0.9 MG/250ML-% EP SOLN
12.0000 mL/h | EPIDURAL | Status: DC | PRN
Start: 2021-01-14 — End: 2021-01-15
  Administered 2021-01-14: 12 mL/h via EPIDURAL
  Filled 2021-01-14: qty 250

## 2021-01-14 MED ORDER — EPHEDRINE 5 MG/ML INJ: 10.0000 mg | INTRAVENOUS | Status: DC | PRN

## 2021-01-14 MED ORDER — LACTATED RINGERS IV SOLN: 500.0000 mL | INTRAVENOUS | Status: DC | PRN

## 2021-01-14 MED ORDER — LIDOCAINE HCL (PF) 1 % IJ SOLN: 30.0000 mL | INTRAMUSCULAR | Status: DC | PRN

## 2021-01-14 MED ORDER — LIDOCAINE HCL (PF) 1 % IJ SOLN
INTRAMUSCULAR | Status: DC | PRN
  Administered 2021-01-14 (×2): 4 mL via EPIDURAL

## 2021-01-14 MED ORDER — SOD CITRATE-CITRIC ACID 500-334 MG/5ML PO SOLN
30.0000 mL | ORAL | Status: DC | PRN
  Filled 2021-01-14: qty 15

## 2021-01-14 MED ORDER — PHENYLEPHRINE 40 MCG/ML (10ML) SYRINGE FOR IV PUSH (FOR BLOOD PRESSURE SUPPORT): 80.0000 ug | PREFILLED_SYRINGE | INTRAVENOUS | Status: DC | PRN

## 2021-01-14 MED ORDER — OXYTOCIN BOLUS FROM INFUSION: 333.0000 mL | Freq: Once | INTRAVENOUS | Status: DC

## 2021-01-14 MED ORDER — OXYCODONE-ACETAMINOPHEN 5-325 MG PO TABS: 2.0000 | ORAL_TABLET | ORAL | Status: DC | PRN

## 2021-01-14 MED ORDER — OXYCODONE-ACETAMINOPHEN 5-325 MG PO TABS: 1.0000 | ORAL_TABLET | ORAL | Status: DC | PRN

## 2021-01-14 MED ORDER — MISOPROSTOL 25 MCG QUARTER TABLET: 25.0000 ug | ORAL_TABLET | ORAL | Status: DC | PRN

## 2021-01-14 MED ORDER — OXYTOCIN-SODIUM CHLORIDE 30-0.9 UT/500ML-% IV SOLN
2.5000 [IU]/h | INTRAVENOUS | Status: DC
  Filled 2021-01-14: qty 500

## 2021-01-14 MED ORDER — DIPHENHYDRAMINE HCL 50 MG/ML IJ SOLN: 12.5000 mg | INTRAMUSCULAR | Status: DC | PRN

## 2021-01-14 MED ORDER — OXYTOCIN-SODIUM CHLORIDE 30-0.9 UT/500ML-% IV SOLN
1.0000 m[IU]/min | INTRAVENOUS | Status: DC
  Administered 2021-01-14 (×2): 2 m[IU]/min via INTRAVENOUS
  Filled 2021-01-14: qty 500

## 2021-01-14 MED ORDER — ONDANSETRON HCL 4 MG/2ML IJ SOLN
4.0000 mg | Freq: Four times a day (QID) | INTRAMUSCULAR | Status: DC | PRN
  Administered 2021-01-15: 4 mg via INTRAVENOUS
  Filled 2021-01-14: qty 2

## 2021-01-14 MED ORDER — BUTORPHANOL TARTRATE 1 MG/ML IJ SOLN: 1.0000 mg | INTRAMUSCULAR | Status: DC | PRN

## 2021-01-14 MED ORDER — TERBUTALINE SULFATE 1 MG/ML IJ SOLN
0.2500 mg | Freq: Once | INTRAMUSCULAR | Status: AC | PRN
  Administered 2021-01-14: 0.25 mg via SUBCUTANEOUS

## 2021-01-14 NOTE — Progress Notes (Signed)
Comfortable, occ pressure BP 132/76   Pulse (!) 50 Comment: asymptomatic  Temp 98.4 F (36.9 C) (Oral)   Resp 17   Ht 5\' 2"  (1.575 m)   Wt 72.6 kg   SpO2 99%   BMI 29.26 kg/m  CE 7/90/0 Cat 1 tracing, baseline 115, occ early decel, + accel w/ FSS, min var TOCO q1-3, pitocin at 18 mU/min  Active labor, anticipate SVD.
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
## 2021-01-14 NOTE — Progress Notes (Signed)
Labor Note  S: s/p epidural, no complaints  O: BP 120/73   Pulse (!) 54   Temp 98.5 F (36.9 C) (Oral)   Resp 15   Ht 5\' 2"  (1.575 m)   Wt 72.6 kg   SpO2 99%   BMI 29.26 kg/m  CE: 3/90/-1 FHR: Baseline 120, +accels, -decels, min to modvariability TOCO q2-3, pitocin at 52mU/min  A/P: This is a 30 y.o. G1P0 at [redacted]w[redacted]d  admitted for IOL. Baby girl, GBS neg FWB: cat 1 MWB: s/p epidural Labor course: Continue to titrate pitocin per protocol  Anticipate SVD

## 2021-01-14 NOTE — Anesthesia Preprocedure Evaluation (Addendum)
Anesthesia Evaluation  Patient identified by MRN, date of birth, ID band Patient awake    Reviewed: Allergy & Precautions, H&P , Patient's Chart, lab work & pertinent test results  Airway Mallampati: II  TM Distance: >3 FB Neck ROM: full    Dental no notable dental hx. (+) Teeth Intact   Pulmonary asthma , former smoker,    Pulmonary exam normal breath sounds clear to auscultation       Cardiovascular negative cardio ROS Normal cardiovascular exam Rhythm:regular Rate:Normal     Neuro/Psych negative neurological ROS  negative psych ROS   GI/Hepatic negative GI ROS, Neg liver ROS,   Endo/Other  negative endocrine ROS  Renal/GU negative Renal ROS  negative genitourinary   Musculoskeletal   Abdominal   Peds  Hematology negative hematology ROS (+)   Anesthesia Other Findings   Reproductive/Obstetrics (+) Pregnancy                             Anesthesia Physical Anesthesia Plan  ASA: II  Anesthesia Plan: Epidural   Post-op Pain Management:    Induction:   PONV Risk Score and Plan:   Airway Management Planned:   Additional Equipment:   Intra-op Plan:   Post-operative Plan:   Informed Consent: I have reviewed the patients History and Physical, chart, labs and discussed the procedure including the risks, benefits and alternatives for the proposed anesthesia with the patient or authorized representative who has indicated his/her understanding and acceptance.       Plan Discussed with: Anesthesiologist  Anesthesia Plan Comments:         Anesthesia Quick Evaluation

## 2021-01-14 NOTE — Anesthesia Procedure Notes (Signed)
Epidural Patient location during procedure: OB Start time: 01/14/2021 2:29 PM End time: 01/14/2021 2:36 PM  Staffing Anesthesiologist: Mal Amabile, MD Performed: anesthesiologist   Preanesthetic Checklist Completed: patient identified, IV checked, site marked, risks and benefits discussed, surgical consent, monitors and equipment checked, pre-op evaluation and timeout performed  Epidural Patient position: sitting Prep: DuraPrep and site prepped and draped Patient monitoring: continuous pulse ox and blood pressure Approach: midline Location: L3-L4 Injection technique: LOR air  Needle:  Needle type: Tuohy  Needle gauge: 17 G Needle length: 9 cm and 9 Needle insertion depth: 5 cm cm Catheter type: closed end flexible Catheter size: 19 Gauge Catheter at skin depth: 10 cm Test dose: negative and Other  Assessment Events: blood not aspirated, injection not painful, no injection resistance, no paresthesia and negative IV test  Additional Notes Patient identified. Risks and benefits discussed including failed block, incomplete  Pain control, post dural puncture headache, nerve damage, paralysis, blood pressure Changes, nausea, vomiting, reactions to medications-both toxic and allergic and post Partum back pain. All questions were answered. Patient expressed understanding and wished to proceed. Sterile technique was used throughout procedure. Epidural site was Dressed with sterile barrier dressing. No paresthesias, signs of intravascular injection Or signs of intrathecal spread were encountered.  Patient was more comfortable after the epidural was dosed. Please see RN's note for documentation of vital signs and FHR which are stable. Reason for block:procedure for pain

## 2021-01-14 NOTE — Progress Notes (Signed)
Pitocin started at 1250. Presented to room at 1254 for prolonged ctx with associated FHR decel down to 70s lasting 31m. Patient laid right then left lateral followed by hands and knees. Pitocin stopped. Uterus palpated very firm throughout, no resting tone. IM terbutaline administered, patient placed back to right lateral with return to baseline of 120s. Currently baseline 135, + accels no decels. Planning epidural. Will restart pitocin after this

## 2021-01-15 ENCOUNTER — Encounter (HOSPITAL_COMMUNITY): Payer: Self-pay | Admitting: Obstetrics and Gynecology

## 2021-01-15 LAB — CBC
HCT: 34.7 % — ABNORMAL LOW (ref 36.0–46.0)
Hemoglobin: 11.2 g/dL — ABNORMAL LOW (ref 12.0–15.0)
MCH: 27.5 pg (ref 26.0–34.0)
MCHC: 32.3 g/dL (ref 30.0–36.0)
MCV: 85 fL (ref 80.0–100.0)
Platelets: 219 10*3/uL (ref 150–400)
RBC: 4.08 MIL/uL (ref 3.87–5.11)
RDW: 15.4 % (ref 11.5–15.5)
WBC: 17.1 10*3/uL — ABNORMAL HIGH (ref 4.0–10.5)
nRBC: 0 % (ref 0.0–0.2)

## 2021-01-15 LAB — RPR: RPR Ser Ql: NONREACTIVE

## 2021-01-15 MED ORDER — IBUPROFEN 600 MG PO TABS
600.0000 mg | ORAL_TABLET | Freq: Four times a day (QID) | ORAL | Status: DC
  Administered 2021-01-15 – 2021-01-16 (×6): 600 mg via ORAL
  Filled 2021-01-15 (×6): qty 1

## 2021-01-15 MED ORDER — TETANUS-DIPHTH-ACELL PERTUSSIS 5-2.5-18.5 LF-MCG/0.5 IM SUSY: 0.5000 mL | PREFILLED_SYRINGE | Freq: Once | INTRAMUSCULAR | Status: DC

## 2021-01-15 MED ORDER — METHYLERGONOVINE MALEATE 0.2 MG/ML IJ SOLN
INTRAMUSCULAR | Status: AC
  Administered 2021-01-15: 0.2 mg via INTRAMUSCULAR
  Filled 2021-01-15: qty 1

## 2021-01-15 MED ORDER — DIPHENHYDRAMINE HCL 25 MG PO CAPS: 25.0000 mg | ORAL_CAPSULE | Freq: Four times a day (QID) | ORAL | Status: DC | PRN

## 2021-01-15 MED ORDER — METHYLERGONOVINE MALEATE 0.2 MG/ML IJ SOLN: 0.2000 mg | Freq: Once | INTRAMUSCULAR | Status: AC

## 2021-01-15 MED ORDER — TRANEXAMIC ACID-NACL 1000-0.7 MG/100ML-% IV SOLN: 1000.0000 mg | INTRAVENOUS | Status: AC

## 2021-01-15 MED ORDER — PRENATAL MULTIVITAMIN CH
1.0000 | ORAL_TABLET | Freq: Every day | ORAL | Status: DC
  Administered 2021-01-15: 1 via ORAL
  Filled 2021-01-15 (×2): qty 1

## 2021-01-15 MED ORDER — ZOLPIDEM TARTRATE 5 MG PO TABS: 5.0000 mg | ORAL_TABLET | Freq: Every evening | ORAL | Status: DC | PRN

## 2021-01-15 MED ORDER — DIBUCAINE (PERIANAL) 1 % EX OINT: 1.0000 "application " | TOPICAL_OINTMENT | CUTANEOUS | Status: DC | PRN

## 2021-01-15 MED ORDER — COCONUT OIL OIL: 1.0000 "application " | TOPICAL_OIL | Status: DC | PRN

## 2021-01-15 MED ORDER — BENZOCAINE-MENTHOL 20-0.5 % EX AERO
1.0000 "application " | INHALATION_SPRAY | CUTANEOUS | Status: DC | PRN
  Administered 2021-01-15: 1 via TOPICAL
  Filled 2021-01-15: qty 56

## 2021-01-15 MED ORDER — ACETAMINOPHEN 325 MG PO TABS: 650.0000 mg | ORAL_TABLET | ORAL | Status: DC | PRN

## 2021-01-15 MED ORDER — SIMETHICONE 80 MG PO CHEW: 80.0000 mg | CHEWABLE_TABLET | ORAL | Status: DC | PRN

## 2021-01-15 MED ORDER — ONDANSETRON HCL 4 MG/2ML IJ SOLN: 4.0000 mg | INTRAMUSCULAR | Status: DC | PRN

## 2021-01-15 MED ORDER — WITCH HAZEL-GLYCERIN EX PADS: 1.0000 "application " | MEDICATED_PAD | CUTANEOUS | Status: DC | PRN

## 2021-01-15 MED ORDER — ONDANSETRON HCL 4 MG PO TABS: 4.0000 mg | ORAL_TABLET | ORAL | Status: DC | PRN

## 2021-01-15 MED ORDER — TRANEXAMIC ACID-NACL 1000-0.7 MG/100ML-% IV SOLN
INTRAVENOUS | Status: AC
  Administered 2021-01-15: 1000 mg via INTRAVENOUS
  Filled 2021-01-15: qty 100

## 2021-01-15 MED ORDER — SENNOSIDES-DOCUSATE SODIUM 8.6-50 MG PO TABS
2.0000 | ORAL_TABLET | Freq: Every day | ORAL | Status: DC
  Administered 2021-01-16: 2 via ORAL
  Filled 2021-01-15: qty 2

## 2021-01-15 NOTE — Progress Notes (Signed)
Pt arrival to [facility] approximately 0350, received report, and per RN and patient urinary catheter left in place for delivery and was not removed until shortly before transfer to mother baby unit and so did not void or have bladder scan before arrival on MBU. Transfer from wheelchair to bed completed with difficulty due to patient's poor mobility of left leg following epidural. Patient transferred to bathroom at approximately 0425 and 0450 using Stedy lift, with large voids both times.

## 2021-01-15 NOTE — Anesthesia Postprocedure Evaluation (Signed)
Anesthesia Post Note  Patient: [name]  Procedure(s) Performed: AN AD HOC LABOR EPIDURAL     Patient location during evaluation: Mother Baby Anesthesia Type: Epidural Level of consciousness: awake and alert Pain management: pain level controlled Vital Signs Assessment: post-procedure vital signs reviewed and stable Respiratory status: spontaneous breathing, nonlabored ventilation and respiratory function stable Cardiovascular status: stable Postop Assessment: no headache, no backache, patient able to bend at knees, no apparent nausea or vomiting, able to ambulate and adequate PO intake Anesthetic complications: no   No complications documented.  Last Vitals:  Vitals:   01/15/21 1025 01/15/21 1400  BP: 122/64 126/68  Pulse: 62 (!) 56  Resp: 18 18  Temp: 36.8 C 36.7 C  SpO2:  100%    Last Pain:  Vitals:   01/15/21 1025  TempSrc: Oral  PainSc:    Pain Goal:                   Pamela Pratt

## 2021-01-15 NOTE — Progress Notes (Signed)
Patient ID: Pamela Pratt, female   DOB: Apr 13, 1991, 30 y.o.   MRN: [id number] DOD  Resting comfortably, nml lochia  Dad bottlefeeding the baby

## 2021-01-16 MED ORDER — OXYCODONE HCL 5 MG PO TABS: 5.0000 mg | ORAL_TABLET | ORAL | Status: DC | PRN

## 2021-01-16 MED ORDER — IBUPROFEN 600 MG PO TABS: 600.0000 mg | ORAL_TABLET | Freq: Four times a day (QID) | ORAL | 1 refills | Status: AC | PRN

## 2021-01-16 NOTE — Discharge Summary (Signed)
Postpartum Discharge Summary  Date of Service updated      Patient Name: Pamela Pratt DOB: 02-05-1991 MRN: [id number]  Date of admission: 01/14/2021 Delivery date:01/15/2021  Delivering provider: Carlisle Cater  Date of discharge: 01/16/2021  Admitting diagnosis: [redacted] weeks gestation of pregnancy [Z3A.39] Intrauterine pregnancy: [redacted]w[redacted]d     Secondary diagnosis:  Active Problems:   [redacted] weeks gestation of pregnancy  Additional problems: none    Discharge diagnosis: Term Pregnancy Delivered                                              Post partum procedures:none Augmentation: AROM and Pitocin Complications: None  Hospital course: Induction of Labor With Vaginal Delivery   30 y.o. yo G1P1001 at [redacted]w[redacted]d was admitted to the hospital 01/14/2021 for induction of labor.  Indication for induction: Favorable cervix at term.  Patient had an uncomplicated labor course as follows: Membrane Rupture Time/Date: 12:24 PM ,01/14/2021   Delivery Method:Vaginal, Spontaneous  Episiotomy: None  Lacerations:  None  Details of delivery can be found in separate delivery note.  Patient had a routine postpartum course. Patient is discharged home 01/16/21.  Newborn Data: Birth date:01/15/2021  Birth time:1:31 AM  Gender:Female  Living status:Living  Apgars:7 ,8  Weight:2605 g   Magnesium Sulfate received: No BMZ received: No Rhophylac:N/A   Physical exam  Vitals:   01/15/21 1400 01/15/21 1900 01/15/21 2047 01/16/21 0550  BP: 126/68 120/76 123/69 117/77  Pulse: (!) 56 (!) 56 (!) 59 (!) 52  Resp: 18 18 16 17   Temp: 98 F (36.7 C) 98 F (36.7 C) 98.2 F (36.8 C) 98.1 F (36.7 C)  TempSrc:   Oral Oral  SpO2: 100% 100%  99%  Weight:      Height:       General: alert, cooperative and no distress Lochia: appropriate Uterine Fundus: firm Incision: N/A DVT Evaluation: No evidence of DVT seen on physical exam. Labs: Lab Results  Component Value Date   WBC 17.1 (H) 01/15/2021   HGB 11.2 (L)  01/15/2021   HCT 34.7 (L) 01/15/2021   MCV 85.0 01/15/2021   PLT 219 01/15/2021   CMP Latest Ref Rng & Units 11/24/2020  Glucose 70 - 99 mg/dL 93  BUN 6 - 20 mg/dL 11  Creatinine 11/26/2020 - 0.73 mg/dL 7.10  Sodium 6.26 - 948 mmol/L 135  Potassium 3.5 - 5.1 mmol/L 3.7  Chloride 98 - 111 mmol/L 107  CO2 22 - 32 mmol/L 19(L)  Calcium 8.9 - 10.3 mg/dL 9.0  Total Protein 6.5 - 8.1 g/dL 546)  Total Bilirubin 0.3 - 1.2 mg/dL 0.7  Alkaline Phos 38 - 126 U/L 53  AST 15 - 41 U/L 19  ALT 0 - 44 U/L 19   Edinburgh Score: Edinburgh Postnatal Depression Scale Screening Tool 01/15/2021  I have been able to laugh and see the funny side of things. 0  I have looked forward with enjoyment to things. 0  I have blamed myself unnecessarily when things went wrong. 1  I have been anxious or worried for no good reason. 0  I have felt scared or panicky for no good reason. 0  Things have been getting on top of me. 0  I have been so unhappy that I have had difficulty sleeping. 0  I have felt sad or miserable. 1  I  have been so unhappy that I have been crying. 0  The thought of harming myself has occurred to me. 0  Edinburgh Postnatal Depression Scale Total 2      After visit meds:  Allergies as of 01/16/2021   No Known Allergies     Medication List    TAKE these medications   acetaminophen 500 MG tablet Commonly known as: TYLENOL Take 1,000 mg by mouth every 6 (six) hours as needed for mild pain or headache.   albuterol 108 (90 Base) MCG/ACT inhaler Commonly known as: VENTOLIN HFA Inhale 1-2 puffs into the lungs every 6 (six) hours as needed for wheezing or shortness of breath.   docusate sodium 100 MG capsule Commonly known as: COLACE Take 100 mg by mouth 3 (three) times daily as needed for mild constipation or moderate constipation.   ibuprofen 600 MG tablet Commonly known as: ADVIL Take 1 tablet (600 mg total) by mouth every 6 (six) hours as needed for cramping.   prenatal multivitamin  Tabs tablet Take 1 tablet by mouth daily at 12 noon.        Discharge home in stable condition Infant Feeding: Bottle and Breast Infant Disposition:rooming in Discharge instruction: per After Visit Summary and Postpartum booklet. Activity: Advance as tolerated. Pelvic rest for 6 weeks.  Diet: routine diet Anticipated Birth Control: Unsure Postpartum Appointment:6 weeks Additional Postpartum F/U: n/a Future Appointments:No future appointments. Follow up Visit:  Follow-up Information    Pratt, Pamela Roys, MD. Schedule an appointment as soon as possible for a visit in 6 week(s).   Specialty: Obstetrics and Gynecology Why: For postpartum visit Contact information: [address] [place] [address] [place] [address] [phone]                   01/16/2021 [name], DO

## 2021-01-16 NOTE — Progress Notes (Signed)
Post Partum Day 2 Subjective: no complaints, up ad lib, voiding, tolerating PO, + flatus and lochia moderate. She is bonding well with baby. Bottlefeeding but wants to try to pump. Denies CP, SOB or HA. Ready for discharge home today  Objective: Blood pressure 117/77, pulse (!) 52, temperature 98.1 F (36.7 C), temperature source Oral, resp. rate 17, height 5\' 2"  (1.575 m), weight 72.6 kg, SpO2 99 %, unknown if currently breastfeeding.  Physical Exam:  General: alert, cooperative and no distress Lochia: appropriate Uterine Fundus: firm Incision: n/a DVT Evaluation: No evidence of DVT seen on physical exam. No significant calf/ankle edema.  Recent Labs    01/14/21 1230 01/15/21 0551  HGB 12.2 11.2*  HCT 36.8 34.7*    Assessment/Plan: Discharge home and Contraception unsure but does desire   LOS: 2 days   Pamela Pratt 01/16/2021, 10:13 AM

## 2021-01-16 NOTE — Discharge Instructions (Signed)
Call office with any concerns [phone] 
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
## 2022-05-13 ENCOUNTER — Encounter: Payer: Self-pay | Admitting: *Deleted

## 2022-09-22 ENCOUNTER — Encounter: Payer: Self-pay | Admitting: Gastroenterology

## 2022-10-15 ENCOUNTER — Encounter: Payer: Self-pay | Admitting: Gastroenterology

## 2022-10-15 ENCOUNTER — Ambulatory Visit (INDEPENDENT_AMBULATORY_CARE_PROVIDER_SITE_OTHER): Payer: No Typology Code available for payment source | Admitting: Gastroenterology

## 2022-10-15 VITALS — BP 110/70 | HR 52 | Ht 63.0 in | Wt 158.0 lb

## 2022-10-15 DIAGNOSIS — K625 Hemorrhage of anus and rectum: Secondary | ICD-10-CM | POA: Diagnosis not present

## 2022-10-15 DIAGNOSIS — K5900 Constipation, unspecified: Secondary | ICD-10-CM

## 2022-10-15 NOTE — Progress Notes (Signed)
HPI : Pamela Pratt is a very pleasant 31 year old female who was referred to [place] by Tona Sensing, FNP for further management of constipation and rectal bleeding.  Patient states that she started having problems with constipation and blood in her stool during pregnancy last year.  The symptoms worsened after delivery.  She reports infrequent bowel movements, typically going every 3 or 5 days.  Sometimes she will go as long as a week without a bowel movement.  Historically, she has had normal bowel movements, typically going on a daily basis. Abdominal pain is not a particular problem for her, unless she is going many days without a bowel movement.  The pain is not severe, she is feels bloated and uncomfortable.  She frequently has small and hard stools, but does have spontaneous large, soft stools on occasion.  She denies problems with straining or difficulty evacuating.  No problems with sensation of incomplete evacuation. She previously had been taking Colace daily which worked well for her, but she did not think she should be taken something daily to help vertigo. She has been seeing blood intermittently for over a year now.  She sees blood with most bowel movements, typically on the toilet paper and sometimes in the stool.  The blood is bright red in color.  She notices the blood more with passage of hard stools.  Sometimes she has pain with passage of large stools.  No problems with itching or burning.  No pain with wiping.  She denies feeling any lumps or bumps.  No family history of colon cancer.  Her weight has been stable.  She denies any changes in her diet that accompanied her change in bowel habits.  She admits that she does not eat a whole lot of fruits or vegetables.  No medication changes.  She takes a multivitamin daily which she thinks has iron.   Past Medical History:  Diagnosis Date   Abscess of buttock, right 07/2011   Asthma      Past Surgical History:  Procedure Laterality  Date   BREAST SURGERY  2003   cyst   COLPOSCOPY  06/02/2011   Family History  Problem Relation Age of Onset   Diabetes Maternal Grandfather    Diabetes Maternal Aunt    Colon cancer Neg Hx    Rectal cancer Neg Hx    Stomach cancer Neg Hx    Esophageal cancer Neg Hx    Liver cancer Neg Hx    Pancreatic cancer Neg Hx    Social History   Tobacco Use   Smoking status: Former    Packs/day: 0.50    Types: Cigarettes    Quit date: 07/09/2020    Years since quitting: 2.2   Smokeless tobacco: Never  Vaping Use   Vaping Use: Never used  Substance Use Topics   Alcohol use: Yes    Comment: social   Drug use: No   Current Outpatient Medications  Medication Sig Dispense Refill   acetaminophen (TYLENOL) 500 MG tablet Take 1,000 mg by mouth every 6 (six) hours as needed for mild pain or headache.     albuterol (PROVENTIL HFA;VENTOLIN HFA) 108 (90 Base) MCG/ACT inhaler Inhale 1-2 puffs into the lungs every 6 (six) hours as needed for wheezing or shortness of breath. 1 Inhaler 0   docusate sodium (COLACE) 100 MG capsule Take 100 mg by mouth 3 (three) times daily as needed for mild constipation or moderate constipation.     ibuprofen (ADVIL) 600 MG tablet  Take 1 tablet (600 mg total) by mouth every 6 (six) hours as needed for cramping. 40 tablet 1   No current facility-administered medications for this visit.   No Known Allergies   Review of Systems: All systems reviewed and negative except where noted in HPI.    No results found.  Physical Exam: BP 110/70   Pulse (!) 52   Ht 5\' 3"  (1.6 m)   Wt 158 lb (71.7 kg)   SpO2 100%   BMI 27.99 kg/m  Constitutional: Pleasant,well-developed, African-American female in no acute distress. HEENT: Normocephalic and atraumatic. Conjunctivae are normal. No scleral icterus. Neck supple.  Cardiovascular: Normal rate, regular rhythm.  Pulmonary/chest: Effort normal and breath sounds normal. No wheezing, rales or rhonchi. Abdominal: Soft,  nondistended, nontender. Bowel sounds active throughout. There are no masses palpable. No hepatomegaly. Extremities: no edema Rectal: (Exam performed with Bison present) Very small residual skin tag noted anterior midline, no external hemorrhoids, no anal fissure.  Digital rectal exam with normal sphincter tone, no fissures palpated, no rectal mass. Neurological: Alert and oriented to person place and time. Skin: Skin is warm and dry. No rashes noted. Psychiatric: Normal mood and affect. Behavior is normal.  CBC    Component Value Date/Time   WBC 17.1 (H) 01/15/2021 0551   RBC 4.08 01/15/2021 0551   HGB 11.2 (L) 01/15/2021 0551   HGB 14.2 08/18/2017 1644   HCT 34.7 (L) 01/15/2021 0551   HCT 43.8 08/18/2017 1644   PLT 219 01/15/2021 0551   PLT 322 08/18/2017 1644   MCV 85.0 01/15/2021 0551   MCV 91 08/18/2017 1644   MCH 27.5 01/15/2021 0551   MCHC 32.3 01/15/2021 0551   RDW 15.4 01/15/2021 0551   RDW 13.2 08/18/2017 1644   LYMPHSABS 1.9 11/24/2020 2220   MONOABS 0.7 11/24/2020 2220   EOSABS 0.0 11/24/2020 2220   BASOSABS 0.0 11/24/2020 2220    CMP     Component Value Date/Time   NA 135 11/24/2020 2220   NA 142 08/18/2017 1644   K 3.7 11/24/2020 2220   CL 107 11/24/2020 2220   CO2 19 (L) 11/24/2020 2220   GLUCOSE 93 11/24/2020 2220   BUN 11 11/24/2020 2220   BUN 12 08/18/2017 1644   CREATININE 0.72 11/24/2020 2220   CALCIUM 9.0 11/24/2020 2220   PROT 5.6 (L) 11/24/2020 2220   PROT 6.4 08/18/2017 1644   ALBUMIN 2.9 (L) 11/24/2020 2220   ALBUMIN 4.4 08/18/2017 1644   AST 19 11/24/2020 2220   ALT 19 11/24/2020 2220   ALKPHOS 53 11/24/2020 2220   BILITOT 0.7 11/24/2020 2220   BILITOT 0.5 08/18/2017 1644   GFRNONAA >60 11/24/2020 2220   GFRAA >60 03/23/2019 2135     ASSESSMENT AND PLAN: 31 year old female with 1+ year history of constipation and bright red blood per rectum, which began during pregnancy.  Rectal bleeding is almost certainly secondary to  internal hemorrhoids.  Rectal exam today was unremarkable, negative for anal fissure.  Constipation is most likely idiopathic, and she reports good response to Colace alone.  We discussed the different types of laxatives used to treat chronic constipation.  I suggest that she try taking Metamucil or similar fiber supplement on a daily basis.  Colace could also be used, but Metamucil may be more effective. With regards to rectal bleeding, I did offer a colonoscopy if the patient desired, but the likelihood of finding an abnormality such as a mass lesion or proctitis/inflammation in this patient is  extremely low.  The patient elected to defer colonoscopy for the moment, and I feel this is reasonable.  I suspect that her rectal bleeding will likely improve with fiber supplementation. If her symptoms are not improving or are worsening, she should follow-up and we should rediscuss a colonoscopy.  Constipation - Daily Metamucil +/- Colace  Hematochezia - Presumed hemorrhoidal - Elected to defer colonoscopy for now - Daily Metamucil as above  Joanne Salah E. Tomasa Rand, MD [facility]  Arby Barrette, FNP

## 2022-10-15 NOTE — Patient Instructions (Signed)
If you are age 32 or older, your body mass index should be between 23-30. Your Body mass index is 27.99 kg/m. If this is out of the aforementioned range listed, please consider follow up with your Primary Care Provider.  If you are age 76 or younger, your body mass index should be between 19-25. Your Body mass index is 27.99 kg/m. If this is out of the aformentioned range listed, please consider follow up with your Primary Care Provider.   Please purchase Metamucil over the counter. Take as directed.   Follow up as needed.  The [facility] providers would like to encourage you to use [facility] to communicate with providers for non-urgent requests or questions.  Due to long hold times on the telephone, sending your provider a message by [facility] may be a faster and more efficient way to get a response.  Please allow 48 business hours for a response.  Please remember that this is for non-urgent requests.   It was a pleasure to see you today!  Thank you for trusting me with your gastrointestinal care!    Scott E.Tomasa Rand, MD

## 2023-04-03 ENCOUNTER — Telehealth: Payer: Self-pay | Admitting: Gastroenterology

## 2023-04-03 NOTE — Telephone Encounter (Signed)
PT is calling to find out if she need to continue metamucil. Please advise.

## 2023-04-03 NOTE — Telephone Encounter (Signed)
Detailed message left on pts voicemail that Dr. Tomasa Rand wanted her to take the metamucil daily to help with constipation and if she did not want to take the metamucil she could try colace but the metamucil would probably be more effective.

## 2024-04-14 ENCOUNTER — Telehealth: Payer: Self-pay | Admitting: Gastroenterology

## 2024-04-14 NOTE — Telephone Encounter (Signed)
 Patient requesting to speak with a nurse ion regards to having bowl habit changes. Please advise.

## 2024-04-14 NOTE — Telephone Encounter (Signed)
 Left message for pt to call back

## 2024-04-19 NOTE — Telephone Encounter (Signed)
 Pt never returned phone call, will await further communication from pt.

## 2025-02-01 ENCOUNTER — Ambulatory Visit: Admitting: Gastroenterology
